# Patient Record
Sex: Female | Born: 1985 | Race: White | Hispanic: No | Marital: Single | Smoking: Former smoker
Health system: Southern US, Community
[De-identification: ages and names within clinical notes are randomized; demographics above are authoritative.]

## PROBLEM LIST (undated history)

## (undated) DIAGNOSIS — R011 Cardiac murmur, unspecified: Secondary | ICD-10-CM

## (undated) DIAGNOSIS — R569 Unspecified convulsions: Secondary | ICD-10-CM

## (undated) DIAGNOSIS — Z975 Presence of (intrauterine) contraceptive device: Secondary | ICD-10-CM

## (undated) DIAGNOSIS — I10 Essential (primary) hypertension: Secondary | ICD-10-CM

## (undated) HISTORY — DX: Presence of (intrauterine) contraceptive device: Z97.5

---
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
## 2006-10-12 ENCOUNTER — Other Ambulatory Visit: Admission: RE | Admit: 2006-10-12 | Discharge: 2006-10-12 | Payer: Self-pay | Admitting: Obstetrics and Gynecology

## 2007-10-22 ENCOUNTER — Other Ambulatory Visit: Admission: RE | Admit: 2007-10-22 | Discharge: 2007-10-22 | Payer: Self-pay | Admitting: Obstetrics and Gynecology

## 2008-10-02 ENCOUNTER — Emergency Department (HOSPITAL_COMMUNITY): Admission: EM | Admit: 2008-10-02 | Discharge: 2008-10-02 | Payer: Self-pay | Admitting: Emergency Medicine

## 2008-10-03 ENCOUNTER — Ambulatory Visit (HOSPITAL_COMMUNITY): Admission: RE | Admit: 2008-10-03 | Discharge: 2008-10-03 | Payer: Self-pay | Admitting: Obstetrics and Gynecology

## 2008-10-31 ENCOUNTER — Other Ambulatory Visit: Admission: RE | Admit: 2008-10-31 | Discharge: 2008-10-31 | Payer: Self-pay | Admitting: Obstetrics and Gynecology

## 2009-01-20 DIAGNOSIS — R569 Unspecified convulsions: Secondary | ICD-10-CM

## 2009-01-20 DIAGNOSIS — I10 Essential (primary) hypertension: Secondary | ICD-10-CM

## 2009-01-20 HISTORY — DX: Essential (primary) hypertension: I10

## 2009-01-20 HISTORY — DX: Unspecified convulsions: R56.9

## 2009-04-30 ENCOUNTER — Inpatient Hospital Stay (HOSPITAL_COMMUNITY): Admission: AD | Admit: 2009-04-30 | Discharge: 2009-05-07 | Payer: Self-pay | Admitting: Obstetrics and Gynecology

## 2009-04-30 ENCOUNTER — Ambulatory Visit: Payer: Self-pay | Admitting: Family Medicine

## 2009-05-03 ENCOUNTER — Encounter: Payer: Self-pay | Admitting: Obstetrics and Gynecology

## 2009-08-23 ENCOUNTER — Other Ambulatory Visit: Admission: RE | Admit: 2009-08-23 | Discharge: 2009-08-23 | Payer: Self-pay | Admitting: Obstetrics and Gynecology

## 2009-11-16 ENCOUNTER — Emergency Department (HOSPITAL_COMMUNITY): Admission: EM | Admit: 2009-11-16 | Discharge: 2009-11-16 | Payer: Self-pay | Admitting: Emergency Medicine

## 2010-04-09 LAB — CBC
MCHC: 34.6 g/dL (ref 30.0–36.0)
MCV: 96.2 fL (ref 78.0–100.0)
Platelets: 172 10*3/uL (ref 150–400)
RBC: 2.97 MIL/uL — ABNORMAL LOW (ref 3.87–5.11)
WBC: 15.3 10*3/uL — ABNORMAL HIGH (ref 4.0–10.5)
WBC: 17.9 10*3/uL — ABNORMAL HIGH (ref 4.0–10.5)

## 2010-04-09 LAB — COMPREHENSIVE METABOLIC PANEL
AST: 38 U/L — ABNORMAL HIGH (ref 0–37)
Albumin: 2.2 g/dL — ABNORMAL LOW (ref 3.5–5.2)
Calcium: 7.2 mg/dL — ABNORMAL LOW (ref 8.4–10.5)
Chloride: 102 mEq/L (ref 96–112)
Creatinine, Ser: 0.55 mg/dL (ref 0.4–1.2)
GFR calc Af Amer: >60 mL/min (ref >60)

## 2010-04-09 LAB — MAGNESIUM: Magnesium: 5.7 mg/dL — ABNORMAL HIGH (ref 1.5–2.5)

## 2010-04-09 LAB — RPR: RPR Ser Ql: NONREACTIVE

## 2010-04-09 LAB — URIC ACID: Uric Acid, Serum: 7.7 mg/dL — ABNORMAL HIGH (ref 2.4–7.0)

## 2010-04-10 LAB — COMPREHENSIVE METABOLIC PANEL
ALT: 16 U/L (ref 0–35)
ALT: 17 U/L (ref 0–35)
ALT: 17 U/L (ref 0–35)
AST: 28 U/L (ref 0–37)
AST: 29 U/L (ref 0–37)
AST: 32 U/L (ref 0–37)
Albumin: 2.2 g/dL — ABNORMAL LOW (ref 3.5–5.2)
Albumin: 2.4 g/dL — ABNORMAL LOW (ref 3.5–5.2)
Albumin: 2.6 g/dL — ABNORMAL LOW (ref 3.5–5.2)
Alkaline Phosphatase: 106 U/L (ref 39–117)
Alkaline Phosphatase: 119 U/L — ABNORMAL HIGH (ref 39–117)
Alkaline Phosphatase: 123 U/L — ABNORMAL HIGH (ref 39–117)
Alkaline Phosphatase: 123 U/L — ABNORMAL HIGH (ref 39–117)
Alkaline Phosphatase: 128 U/L — ABNORMAL HIGH (ref 39–117)
BUN: 1 mg/dL — ABNORMAL LOW (ref 6–23)
BUN: 2 mg/dL — ABNORMAL LOW (ref 6–23)
BUN: 3 mg/dL — ABNORMAL LOW (ref 6–23)
CO2: 25 mEq/L (ref 19–32)
CO2: 25 mEq/L (ref 19–32)
CO2: 26 mEq/L (ref 19–32)
Calcium: 8.2 mg/dL — ABNORMAL LOW (ref 8.4–10.5)
Chloride: 103 mEq/L (ref 96–112)
Chloride: 105 mEq/L (ref 96–112)
Chloride: 105 mEq/L (ref 96–112)
Creatinine, Ser: 0.52 mg/dL (ref 0.4–1.2)
GFR calc Af Amer: >60 mL/min (ref >60)
GFR calc Af Amer: >60 mL/min (ref >60)
GFR calc non Af Amer: >60 mL/min (ref >60)
GFR calc non Af Amer: >60 mL/min (ref >60)
GFR calc non Af Amer: >60 mL/min (ref >60)
Glucose, Bld: 126 mg/dL — ABNORMAL HIGH (ref 70–99)
Glucose, Bld: 90 mg/dL (ref 70–99)
Glucose, Bld: 90 mg/dL (ref 70–99)
Potassium: 2.6 mEq/L — CL (ref 3.5–5.1)
Potassium: 2.7 mEq/L — CL (ref 3.5–5.1)
Potassium: 2.7 mEq/L — CL (ref 3.5–5.1)
Potassium: 3 mEq/L — ABNORMAL LOW (ref 3.5–5.1)
Potassium: 3.3 mEq/L — ABNORMAL LOW (ref 3.5–5.1)
Sodium: 138 mEq/L (ref 135–145)
Sodium: 138 mEq/L (ref 135–145)
Sodium: 139 mEq/L (ref 135–145)
Sodium: 139 mEq/L (ref 135–145)
Total Bilirubin: 0.5 mg/dL (ref 0.3–1.2)
Total Bilirubin: 0.7 mg/dL (ref 0.3–1.2)
Total Bilirubin: 0.7 mg/dL (ref 0.3–1.2)
Total Protein: 5.7 g/dL — ABNORMAL LOW (ref 6.0–8.3)

## 2010-04-10 LAB — DIFFERENTIAL
Eosinophils Absolute: 0 10*3/uL (ref 0.0–0.7)
Eosinophils Relative: 0 % (ref 0–5)
Lymphocytes Relative: 6 % — ABNORMAL LOW (ref 12–46)
Lymphs Abs: 1.1 10*3/uL (ref 0.7–4.0)
Monocytes Absolute: 0.5 10*3/uL (ref 0.1–1.0)
Monocytes Relative: 3 % (ref 3–12)

## 2010-04-10 LAB — CBC
HCT: 31.3 % — ABNORMAL LOW (ref 36.0–46.0)
HCT: 31.6 % — ABNORMAL LOW (ref 36.0–46.0)
HCT: 33.8 % — ABNORMAL LOW (ref 36.0–46.0)
HCT: 35.7 % — ABNORMAL LOW (ref 36.0–46.0)
Hemoglobin: 10.6 g/dL — ABNORMAL LOW (ref 12.0–15.0)
Hemoglobin: 10.8 g/dL — ABNORMAL LOW (ref 12.0–15.0)
Hemoglobin: 11 g/dL — ABNORMAL LOW (ref 12.0–15.0)
Hemoglobin: 11.6 g/dL — ABNORMAL LOW (ref 12.0–15.0)
Hemoglobin: 12 g/dL (ref 12.0–15.0)
MCHC: 34.3 g/dL (ref 30.0–36.0)
MCHC: 34.3 g/dL (ref 30.0–36.0)
MCV: 94.8 fL (ref 78.0–100.0)
MCV: 96.2 fL (ref 78.0–100.0)
Platelets: 150 10*3/uL (ref 150–400)
Platelets: 157 10*3/uL (ref 150–400)
Platelets: 173 10*3/uL (ref 150–400)
Platelets: 176 10*3/uL (ref 150–400)
RBC: 3.22 MIL/uL — ABNORMAL LOW (ref 3.87–5.11)
RDW: 14.1 % (ref 11.5–15.5)
RDW: 14.4 % (ref 11.5–15.5)
WBC: 10.3 10*3/uL (ref 4.0–10.5)
WBC: 18.1 10*3/uL — ABNORMAL HIGH (ref 4.0–10.5)
WBC: 9.5 10*3/uL (ref 4.0–10.5)
WBC: 9.8 10*3/uL (ref 4.0–10.5)

## 2010-04-10 LAB — CREATININE CLEARANCE, URINE, 24 HOUR
Collection Interval-CRCL: 24 hours
Creatinine Clearance: 158 mL/min — ABNORMAL HIGH (ref 75–115)
Creatinine, 24H Ur: 1185 mg/d (ref 700–1800)
Urine Total Volume-CRCL: 3310 mL

## 2010-04-10 LAB — URIC ACID: Uric Acid, Serum: 6 mg/dL (ref 2.4–7.0)

## 2010-04-10 LAB — URINALYSIS, MICROSCOPIC ONLY
Bilirubin Urine: NEGATIVE
Hgb urine dipstick: NEGATIVE
Nitrite: NEGATIVE
Specific Gravity, Urine: 1.015 (ref 1.005–1.030)
pH: 7 (ref 5.0–8.0)

## 2010-04-10 LAB — MRSA PCR SCREENING: MRSA by PCR: NEGATIVE

## 2010-04-10 LAB — URINE CULTURE: Special Requests: NEGATIVE

## 2010-04-26 LAB — HCG, QUANTITATIVE, PREGNANCY: hCG, Beta Chain, Quant, S: 81792 m[IU]/mL — ABNORMAL HIGH (ref <5)

## 2010-06-07 NOTE — Discharge Summary (Signed)
NAME:  Frances Nguyen, Frances Nguyen                        ACCOUNT NO.:  [id number]   MEDICAL RECORD NO.:  [id number]                   PATIENT TYPE:  INP   LOCATION:  A330                                 FACILITY:  [facility]   PHYSICIAN:  Langley Gauss, M.D.                DATE OF BIRTH:  [birth year]   DATE OF ADMISSION:  07/16/2003  DATE OF DISCHARGE:  07/18/2003                                 DISCHARGE SUMMARY   DISCHARGE MEDICATIONS:  1. Zithromax 1 g p.o. x1.   The patient and her partner, this is upon request of the family.  They were  informed that both gonorrhea and chlamydia cultures obtained upon admission  in the emergency room by Dr. Rosalia Hammers were negative.  The patient does have a  history of prior STD one year previously which was treated.  They are very  apprehensive regarding this occurrence, and thus they request continued  treatment at this time.  Thus, at the time of discharge, 1 g p.o. x1 is  written both for the patient and for her partner.  The patient has received  three doses of Rocephin 1 g IV.  In addition, she did receive 1 g of  Zithromax already in the emergency room.   HOSPITAL COURSE:  The patient was seen by Dr. Rosalia Hammers July 16, 2003.  Admission  orders given.  Hospital care on July 17, 2003, per Dr. Lisette Grinder. T-max of 102  in the a.m.  Subsequently, the patient had marked decrease in pelvic pain.  She required no additional pain medication during the daytime.  She  tolerated regular general diet, was continued on Rocephin 1 g IV q.12h.  Should the patient continue to improve, she will be discharged to home in  the a.m. of July 18, 2003.  The patient did take her Ortho-Evra patch off  upon developing any nausea.  It has now been replaced.  The patient will  subsequently follow up with [facility] who has already been  contacting regarding placement of this patch.     ___________________________________________                                         Langley Gauss,  M.D.   DC/MEDQ  D:  07/17/2003  T:  07/18/2003  Job:  [id number]   cc:   [facility]

## 2010-06-07 NOTE — H&P (Signed)
NAME:  Frances Nguyen, Frances Nguyen                        ACCOUNT NO.:  [id number]   MEDICAL RECORD NO.:  [id number]                   PATIENT TYPE:  INP   LOCATION:  A330                                 FACILITY:  [facility]   PHYSICIAN:  Langley Gauss, M.D.                DATE OF BIRTH:  Dec 08, 1985   DATE OF ADMISSION:  07/16/2003  DATE OF DISCHARGE:                                HISTORY & PHYSICAL   IDENTIFYING INFORMATION:  The patient is evaluated on July 16, 2003 in the  emergency room by the emergency room physician, Dr. Margarita Grizzle.  I  admitted the patient to the hospital via verbal orders on July 16, 2003 with  the diagnosis of pelvic inflammatory disease.   SUBSEQUENT HOSPITAL CARE:  I first saw the patient the a.m. of July 17, 2003  on the third floor.  The patient is a 25 year old gravida 0, para 0 with her  last menstrual period July 06, 2003 who has just begun using Ortho-Evra  birth control patches.  She presents to the emergency room afternoon of July 16, 2003 with pain assessment of 10 out of 10.  Describes primarily  abdominal, gastrointestinal, and pelvic pain as well as some nausea.  She  initially felt the nausea was due to the birth control patch, thus she took  it off.  The pain is primarily suprapubic and bilateral in nature and was a  rather acute onset the a.m. of July 16, 2003.  Her past medical history is  otherwise negative.   SOCIAL HISTORY:  The patient is sexually active.  She is a nonsmoker, denies  any alcohol or drug use.   She has no known drug allergies.  Only medications is the Ortho-Evra patch.   PHYSICAL EXAMINATION:  VITAL SIGNS:  The patient presented to the emergency  room July 16, 2003 with a T-max of 101.9, blood pressure 106/60, heart rate  of 99, respiratory rate of 18.  HEENT:  Negative.  No adenopathy.  NECK:  Supple.  Thyroid is nonpalpable.  LUNGS:  Clear.  CARDIOVASCULAR:  Regular rate and rhythm.  PELVIC:  This was the initial evaluation  on July 16, 2003.  Dr. Rosalia Hammers  evaluated the patient and described cervical motion tenderness as well as  significant pelvic pain.   Urinalysis revealed a negative urine pregnancy test.  Hemoglobin 12.3,  hematocrit 34.7, white blood count of 12.9.  Liver function tests,  electrolytes within normal limits.  Wet prep is pertinent only for a few  white blood cells.  Group A strep screen was negative.  GC and Chlamydia  cultures performed by Dr. Rosalia Hammers in the emergency room.  A CT scan of the  pelvis was negative for appendicitis and revealed normal-appearing pelvic  structures.   I was thereafter contacted July 16, 2003 by Dr. Rosalia Hammers.  She told me following  her evaluation the presumptive diagnosis was that of pelvic  inflammatory  disease, thus the patient is admitted p.m. of July 16, 2003.  On July 17, 2003, the patient has had a T-max in the a.m. of 102.  She continues to have  some pelvic pain although it is markedly diminished.  She is tolerating a  regular general diet.  Abdomen is soft and flat.  No rebound, guarding, or  peritoneal signs are identified.   ASSESSMENT AND PLAN ON ADMISSION:  The patient is treated at this time with  1 g of Rocephin IV q.12 h.  She has received IV morphine in the emergency  room as well as 1 g of p.o. Zithromax.  The patient is to be admitted and  continued in hospital until 24 hours afebrile.  Of note, the patient is  registered in the emergency room as an unassigned patient, is listed as  Medicaid [employer].  After I rounded on the patient and admitted her  the a.m. of July 17, 2003, only after that point in time was I notified by  the family that the patient had been seen once at [facility].  Thus  with her management plan already in place and with good response, I  continued her hospital care.     ___________________________________________                                         Langley Gauss, M.D.   DC/MEDQ  D:  07/17/2003  T:   07/17/2003  Job:  [id number]   cc:   [facility]

## 2011-04-10 IMAGING — US US OB TRANSVAGINAL MODIFY
1 series · 14 of 28 positions shown · non-contrast
Comparison: None

CLINICAL DATA: Pregnant, bleeding

OBSTETRIC <14 WK US AND TRANSVAGINAL OB US
TECHNIQUE: Both transabdominal and transvaginal ultrasound
examinations were performed for complete evaluation of the
gestation as well as the maternal uterus, adnexal regions, and
pelvic cul-de-sac.

[Series 1: us ob transvaginal modify · 0.24mm/px · 46 acquisitions, 14 frames shown]
[im 2/46]
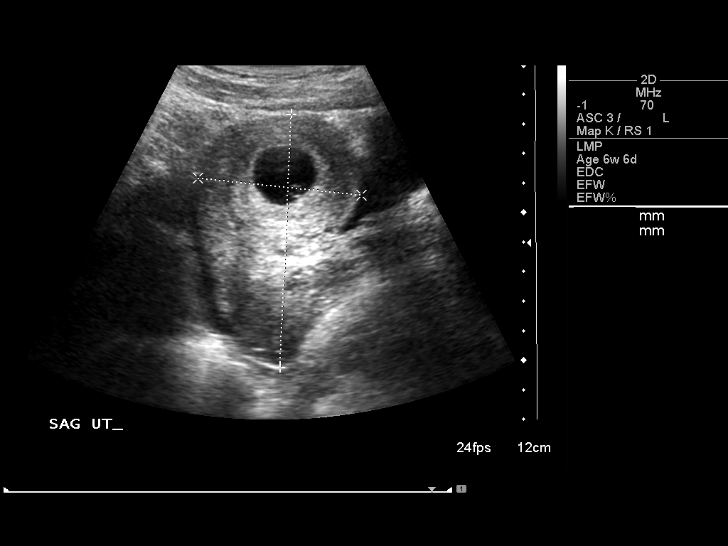
[im 6/46]
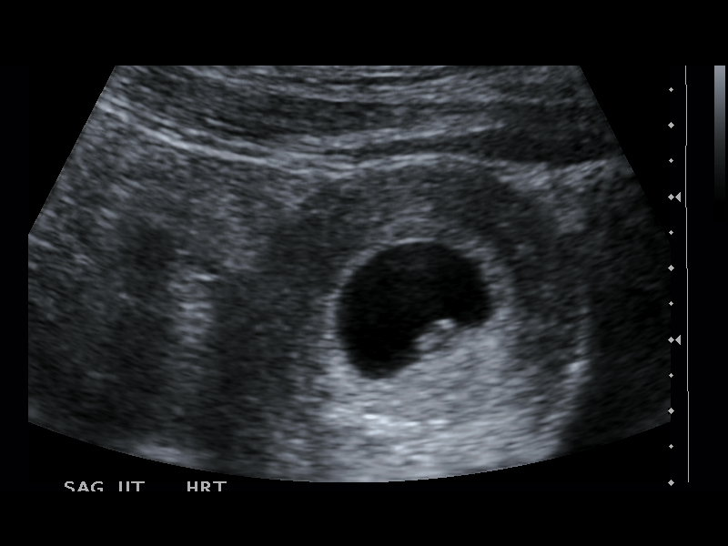
[im 9/46]
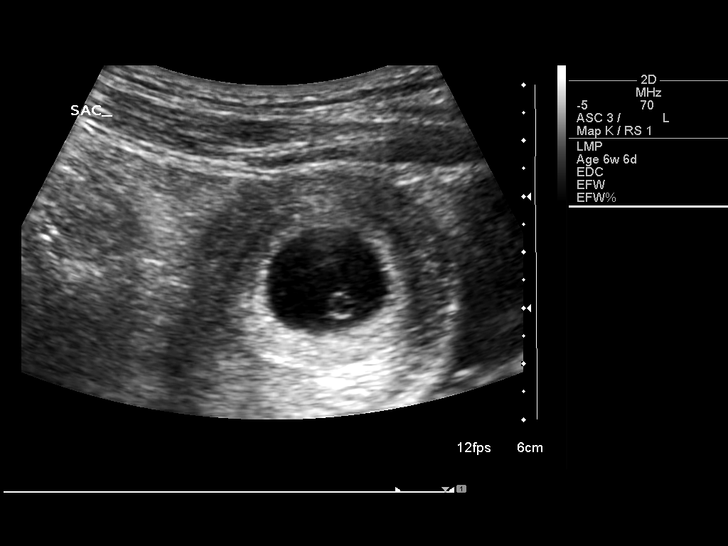
[im 12/46]
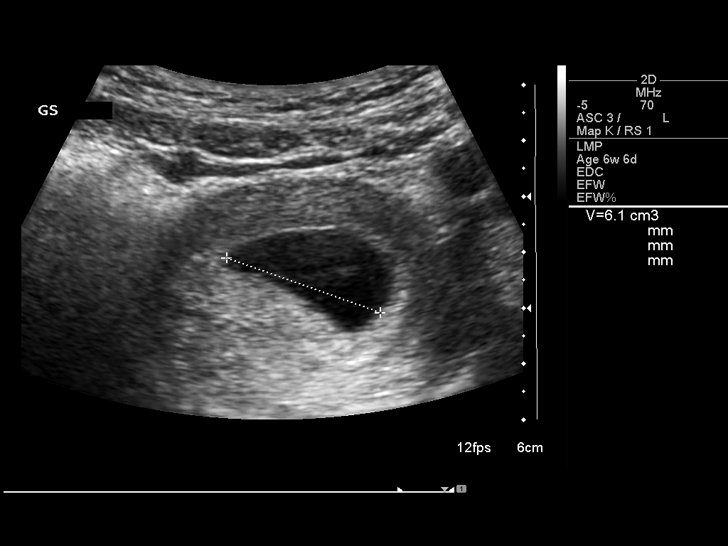
[im 16/46]
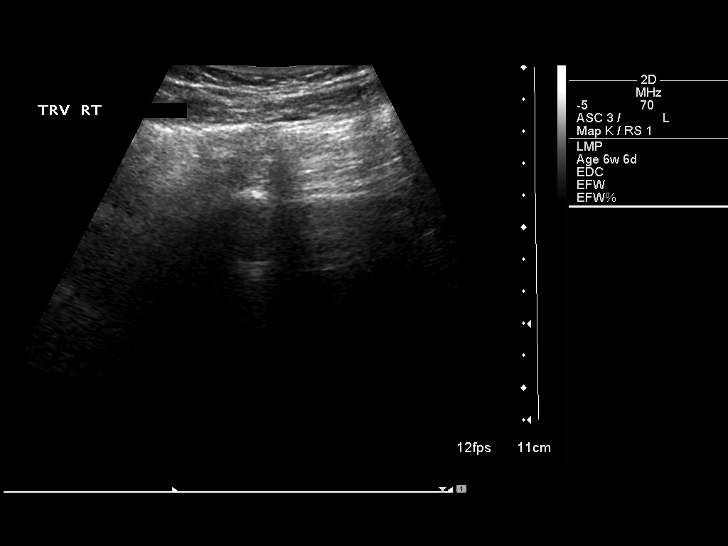
[im 19/46]
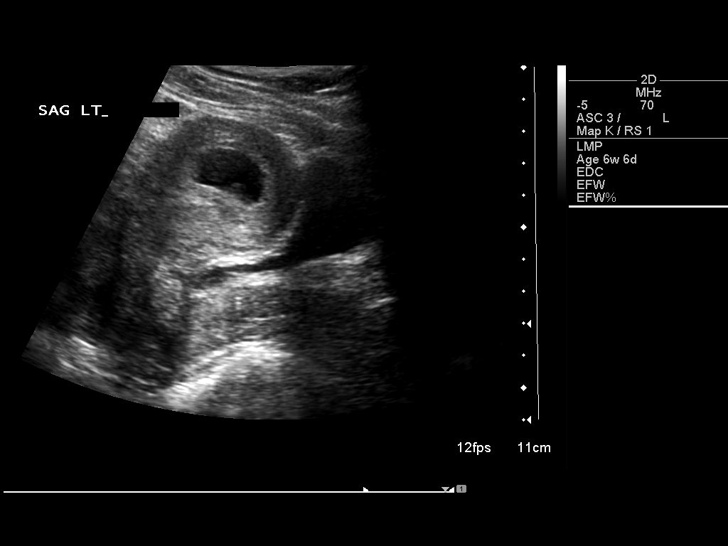
[im 22/46]
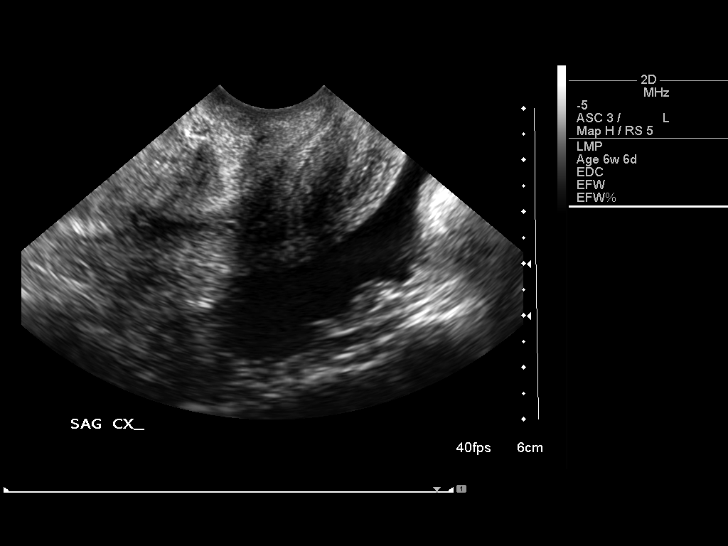
[im 26/46]
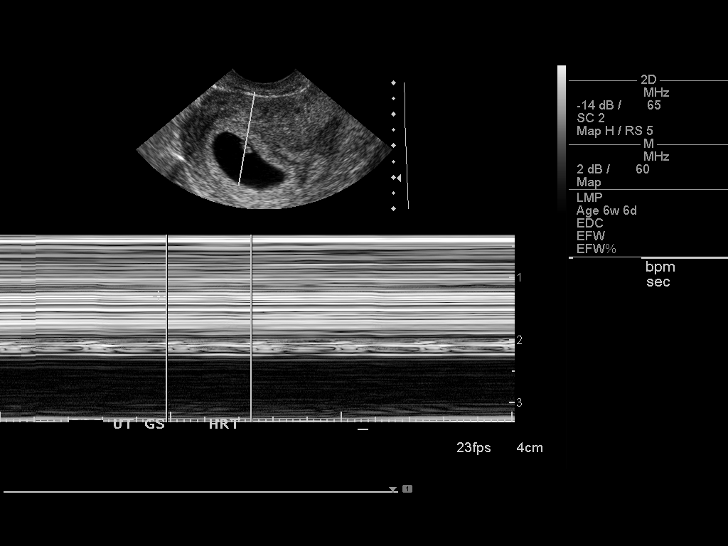
[im 29/46]
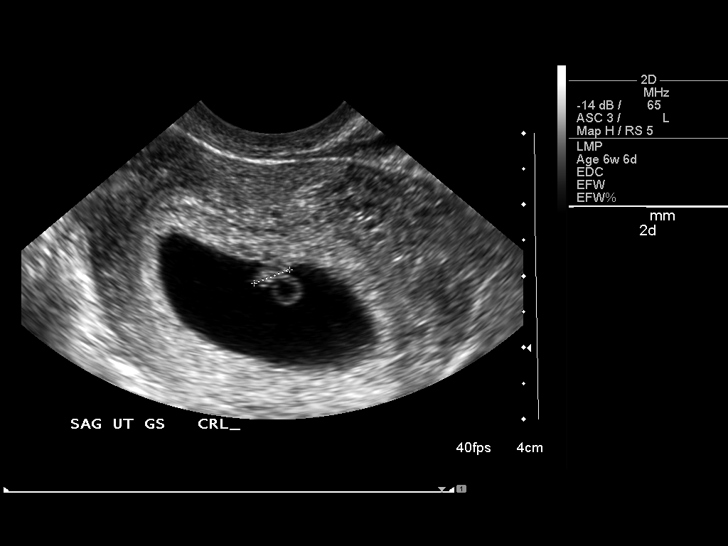
[im 32/46]
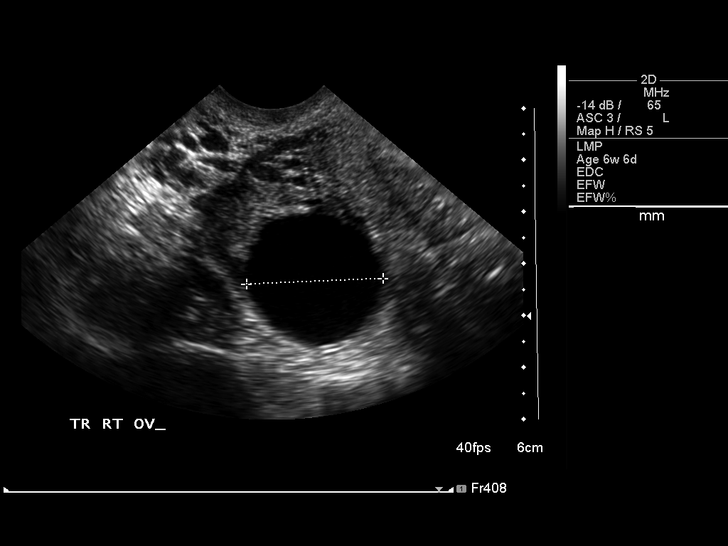
[im 36/46]
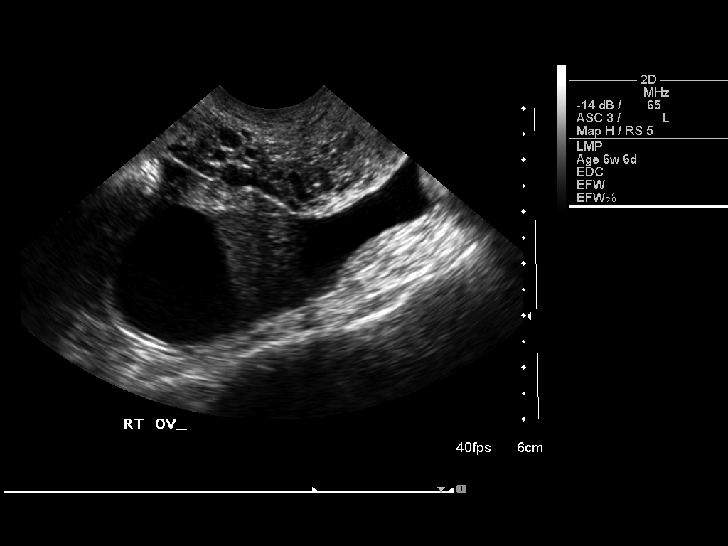
[im 39/46]
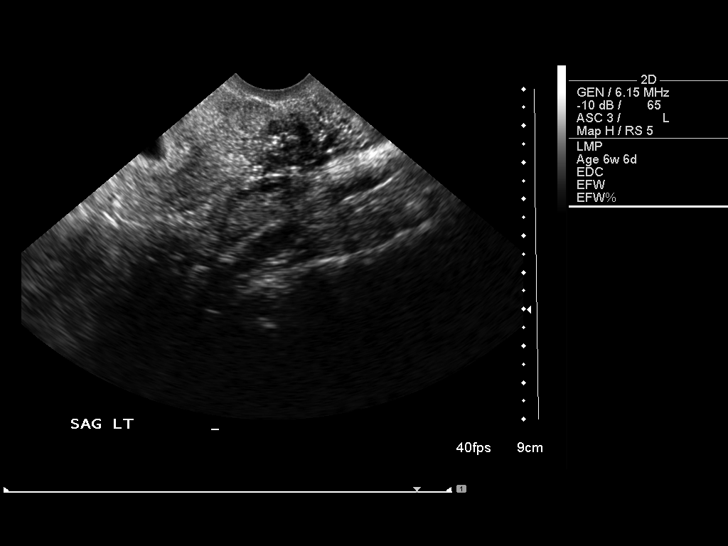
[im 42/46]
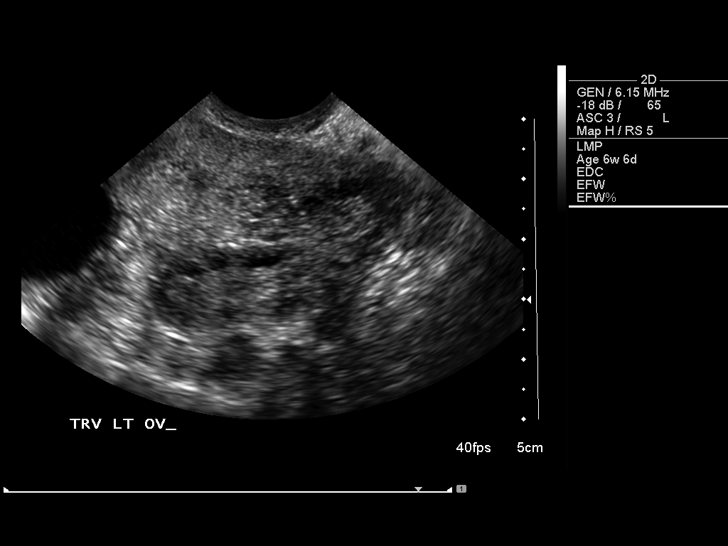
[im 46/46]
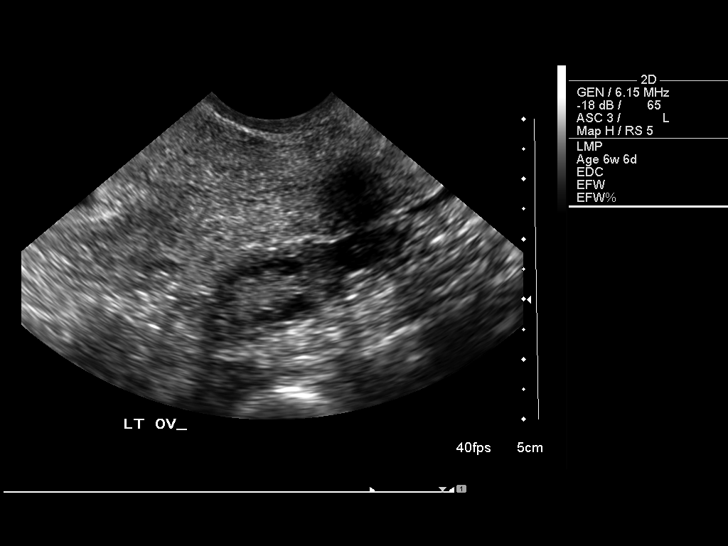

[14 of 28 positions shown; findings below may reference images not displayed]

Intrauterine gestational sac: Present
Yolk sac: Present
Embryo: Present
Cardiac Activity: Present
Heart Rate: 121 bpm

MSD:  25 mm       7 w   4 d
CRL: 6.2 mm           6  w   3  d         US EDC: 05/22/2009

Maternal uterus/adnexae:
No subchorionic hemorrhage.
Left ovary normal size and morphology, 3.0 x 1.3 x 3.2 cm.
Right ovary measures 4.4 x 2.8 x 3.4 cm contains a 2.4 x 2.5 x
cm corpus luteal cyst.
No adnexal masses.
Small amount of free fluid in pelvis.
IMPRESSION: Single live early intrauterine gestation measured at 7 weeks 0 days
average sonographic gestational age.
No acute abnormalities.

## 2012-08-12 ENCOUNTER — Emergency Department (HOSPITAL_COMMUNITY)
Admission: EM | Admit: 2012-08-12 | Discharge: 2012-08-13 | Disposition: A | Discharge status: Home / Self Care | Payer: Self-pay | Attending: Emergency Medicine | Admitting: Emergency Medicine

## 2012-08-12 ENCOUNTER — Encounter (HOSPITAL_COMMUNITY): Payer: Self-pay | Admitting: *Deleted

## 2012-08-12 DIAGNOSIS — R05 Cough: Secondary | ICD-10-CM | POA: Insufficient documentation

## 2012-08-12 DIAGNOSIS — R059 Cough, unspecified: Secondary | ICD-10-CM | POA: Insufficient documentation

## 2012-08-12 DIAGNOSIS — R51 Headache: Secondary | ICD-10-CM | POA: Insufficient documentation

## 2012-08-12 DIAGNOSIS — F17200 Nicotine dependence, unspecified, uncomplicated: Secondary | ICD-10-CM | POA: Insufficient documentation

## 2012-08-12 DIAGNOSIS — R071 Chest pain on breathing: Secondary | ICD-10-CM | POA: Insufficient documentation

## 2012-08-12 DIAGNOSIS — R0789 Other chest pain: Secondary | ICD-10-CM

## 2012-08-12 MED ORDER — DIPHENHYDRAMINE HCL 25 MG PO CAPS
25.0000 mg | ORAL_CAPSULE | Freq: Once | ORAL | Status: AC
  Administered 2012-08-12: 25 mg via ORAL
  Filled 2012-08-12: qty 1

## 2012-08-12 MED ORDER — KETOROLAC TROMETHAMINE 60 MG/2ML IM SOLN
60.0000 mg | Freq: Once | INTRAMUSCULAR | Status: AC
  Administered 2012-08-12: 60 mg via INTRAMUSCULAR
  Filled 2012-08-12: qty 2

## 2012-08-12 MED ORDER — ONDANSETRON 4 MG PO TBDP
4.0000 mg | ORAL_TABLET | Freq: Once | ORAL | Status: AC
  Administered 2012-08-12: 4 mg via ORAL
  Filled 2012-08-12: qty 1

## 2012-08-12 NOTE — ED Notes (Signed)
Pt reporting headache for 2 days.  Reporting severe cough.  States that she recently recovered from a URI.  Pt also reporting pain in right side of chest.  No vomiting, but reporting slight nausea.

## 2012-08-12 NOTE — ED Provider Notes (Signed)
CSN: [id number]     Arrival date & time 08/12/12  2130 History     First MD Initiated Contact with Patient 08/12/12 2301     Chief Complaint  Patient presents with  . Headache  . Cough  . Chest Pain   (Consider location/radiation/quality/duration/timing/severity/associated sxs/prior Treatment) HPI HPI Comments: Frances Nguyen is a 27 y.o. female who presents to the Emergency Department complaining of headache x 2 days that responds somewhat to tylenol. She also has right chest pain that is worse with coughing. Denies fever, chills, nausea, vomiting, shortness of breath.  History reviewed. No pertinent past medical history. History reviewed. No pertinent past surgical history. History reviewed. No pertinent family history. History  Substance Use Topics  . Smoking status: Current Every Day Smoker  . Smokeless tobacco: Not on file  . Alcohol Use: No   OB History   Grav Para Term Preterm Abortions TAB SAB Ect Mult Living                 Review of Systems  Constitutional: Negative for fever.       10 Systems reviewed and are negative for acute change except as noted in the HPI.  HENT: Negative for congestion.   Eyes: Negative for discharge and redness.  Respiratory: Negative for cough and shortness of breath.   Cardiovascular: Negative for chest pain.  Gastrointestinal: Negative for vomiting and abdominal pain.  Musculoskeletal: Negative for back pain.  Skin: Negative for rash.  Neurological: Positive for headaches. Negative for syncope and numbness.  Psychiatric/Behavioral:       No behavior change.    Allergies  Review of patient's allergies indicates no known allergies.  Home Medications   Current Outpatient Rx  Name  Route  Sig  Dispense  Refill  . acetaminophen (TYLENOL) 500 MG tablet   Oral   Take 1,000 mg by mouth every 6 (six) hours as needed for pain.          BP 127/88  Pulse 77  Temp(Src) 98.8 F (37.1 C) (Oral)  Resp 20  Ht 5\' 2"  (1.575 m)   Wt 113 lb (51.256 kg)  BMI 20.66 kg/m2  SpO2 100% Physical Exam  Nursing note and vitals reviewed. Constitutional:  Awake, alert, nontoxic appearance.  HENT:  Head: Normocephalic and atraumatic.  Eyes: EOM are normal. Pupils are equal, round, and reactive to light.  Neck: Neck supple.  Cardiovascular: Normal rate and intact distal pulses.   Pulmonary/Chest: Effort normal and breath sounds normal. She exhibits no tenderness.  Abdominal: Soft. Bowel sounds are normal. There is no tenderness. There is no rebound.  Musculoskeletal: She exhibits no tenderness.  Baseline ROM, no obvious new focal weakness.  Neurological:  Mental status and motor strength appears baseline for patient and situation.  Skin: No rash noted.  Psychiatric: She has a normal mood and affect.    ED Course   Procedures (including critical care time) Medications  ketorolac (TORADOL) injection 60 mg (60 mg Intramuscular Given 08/12/12 2326)  diphenhydrAMINE (BENADRYL) capsule 25 mg (25 mg Oral Given 08/12/12 2326)  ondansetron (ZOFRAN-ODT) disintegrating tablet 4 mg (4 mg Oral Given 08/12/12 2326)    MDM  Patient presents with headache x 2 days. Given toradol, benadryl and zofran with relief. Pt stable in ED with no significant deterioration in condition.The patient appears reasonably screened and/or stabilized for discharge and I doubt any other medical condition or other [facility] requiring further screening, evaluation, or treatment in the ED at this time  prior to discharge.  MDM Reviewed: nursing note and vitals     Nicoletta Dress. Colon Branch, MD 08/13/12 [phone]

## 2013-01-18 ENCOUNTER — Telehealth: Payer: Self-pay | Admitting: Obstetrics and Gynecology

## 2013-01-18 NOTE — Telephone Encounter (Signed)
Spoke with pt she was seen in ER at [facility]. I advised the pt to call the hospital and see if there was anyway she could get the results from them.

## 2013-08-02 ENCOUNTER — Other Ambulatory Visit (HOSPITAL_COMMUNITY)
Admission: RE | Admit: 2013-08-02 | Discharge: 2013-08-02 | Disposition: A | Discharge status: Home / Self Care | Payer: Medicaid Other | Source: Ambulatory Visit | Attending: Adult Health | Admitting: Adult Health

## 2013-08-02 ENCOUNTER — Encounter: Payer: Self-pay | Admitting: Adult Health

## 2013-08-02 ENCOUNTER — Encounter: Payer: Medicaid Other | Admitting: Adult Health

## 2013-08-02 ENCOUNTER — Ambulatory Visit (INDEPENDENT_AMBULATORY_CARE_PROVIDER_SITE_OTHER): Payer: Medicaid Other | Admitting: Adult Health

## 2013-08-02 VITALS — BP 100/62 | HR 74 | Ht 62.0 in | Wt 121.0 lb

## 2013-08-02 DIAGNOSIS — Z113 Encounter for screening for infections with a predominantly sexual mode of transmission: Secondary | ICD-10-CM | POA: Insufficient documentation

## 2013-08-02 DIAGNOSIS — Z975 Presence of (intrauterine) contraceptive device: Secondary | ICD-10-CM

## 2013-08-02 DIAGNOSIS — Z01419 Encounter for gynecological examination (general) (routine) without abnormal findings: Secondary | ICD-10-CM | POA: Insufficient documentation

## 2013-08-02 DIAGNOSIS — Z3049 Encounter for surveillance of other contraceptives: Secondary | ICD-10-CM

## 2013-08-02 HISTORY — DX: Presence of (intrauterine) contraceptive device: Z97.5

## 2013-08-02 NOTE — Progress Notes (Signed)
Patient ID: Frances Nguyen, female   DOB: 1985-05-01, 28 y.o.   MRN: [id number] History of Present Illness: Frances Nguyen is a 28 year old white female, in for a family planning pap and physical.She complains of some occasional pain with sex and pain right side.Has IUD for about 4 years now and wants it removed, not sure if wants the pill or will proceed with trying to get pregnant.   Current Medications, Allergies, Past Medical History, Past Surgical History, Family History and Social History were reviewed in [facility] electronic medical record.     Review of Systems: Patient denies any headaches, blurred vision, shortness of breath, chest pain, abdominal pain, problems with bowel movements, or urination. No joint pain or mood swings, see HPI for positives.    Physical Exam:BP 100/62  Pulse 74  Ht 5\' 2"  (1.575 m)  Wt 121 lb (54.885 kg)  BMI 22.13 kg/m2 General:  Well developed, well nourished, no acute distress Skin:  Warm and dry, has tattoos Neck:  Midline trachea, normal thyroid Lungs; Clear to auscultation bilaterally Breast:  No dominant palpable mass, retraction, or nipple discharge Cardiovascular: Regular rate and rhythm Abdomen:  Soft, non tender, no hepatosplenomegaly Pelvic:  External genitalia is normal in appearance.  The vagina is normal in appearance. The cervix is bulbous.+IUD strings at os, pap with GC/CHL performed.  Uterus is felt to be normal size, shape, and contour.  No  adnexal masses, mild  tenderness noted RLQ. Extremities:  No swelling or varicosities noted Psych:  No mood changes, alert and cooperative,seems happy   Impression: Family planning gyn exam IUD in place    Plan: Check HIV,RPR,HSV 2 Physical in 1 year Return in am for IUD removal Review handout on contraception and talk with partner tonight to see what they want to do

## 2013-08-02 NOTE — Patient Instructions (Signed)
Contraception Choices Contraception (birth control) is the use of any methods or devices to prevent pregnancy. Below are some methods to help avoid pregnancy. HORMONAL METHODS   Contraceptive implant. This is a thin, plastic tube containing progesterone hormone. It does not contain estrogen hormone. Your health care provider inserts the tube in the inner part of the upper arm. The tube can remain in place for up to 3 years. After 3 years, the implant must be removed. The implant prevents the ovaries from releasing an egg (ovulation), thickens the cervical mucus to prevent sperm from entering the uterus, and thins the lining of the inside of the uterus.  Progesterone-only injections. These injections are given every 3 months by your health care provider to prevent pregnancy. This synthetic progesterone hormone stops the ovaries from releasing eggs. It also thickens cervical mucus and changes the uterine lining. This makes it harder for sperm to survive in the uterus.  Birth control pills. These pills contain estrogen and progesterone hormone. They work by preventing the ovaries from releasing eggs (ovulation). They also cause the cervical mucus to thicken, preventing the sperm from entering the uterus. Birth control pills are prescribed by a health care provider.Birth control pills can also be used to treat heavy periods.  Minipill. This type of birth control pill contains only the progesterone hormone. They are taken every day of each month and must be prescribed by your health care provider.  Birth control patch. The patch contains hormones similar to those in birth control pills. It must be changed once a week and is prescribed by a health care provider.  Vaginal ring. The ring contains hormones similar to those in birth control pills. It is left in the vagina for 3 weeks, removed for 1 week, and then a new one is put back in place. The patient must be comfortable inserting and removing the ring  from the vagina.A health care provider's prescription is necessary.  Emergency contraception. Emergency contraceptives prevent pregnancy after unprotected sexual intercourse. This pill can be taken right after sex or up to 5 days after unprotected sex. It is most effective the sooner you take the pills after having sexual intercourse. Most emergency contraceptive pills are available without a prescription. Check with your pharmacist. Do not use emergency contraception as your only form of birth control. BARRIER METHODS   Female condom. This is a thin sheath (latex or rubber) that is worn over the penis during sexual intercourse. It can be used with spermicide to increase effectiveness.  Female condom. This is a soft, loose-fitting sheath that is put into the vagina before sexual intercourse.  Diaphragm. This is a soft, latex, dome-shaped barrier that must be fitted by a health care provider. It is inserted into the vagina, along with a spermicidal jelly. It is inserted before intercourse. The diaphragm should be left in the vagina for 6 to 8 hours after intercourse.  Cervical cap. This is a round, soft, latex or plastic cup that fits over the cervix and must be fitted by a health care provider. The cap can be left in place for up to 48 hours after intercourse.  Sponge. This is a soft, circular piece of polyurethane foam. The sponge has spermicide in it. It is inserted into the vagina after wetting it and before sexual intercourse.  Spermicides. These are chemicals that kill or block sperm from entering the cervix and uterus. They come in the form of creams, jellies, suppositories, foam, or tablets. They do not require a   prescription. They are inserted into the vagina with an applicator before having sexual intercourse. The process must be repeated every time you have sexual intercourse. INTRAUTERINE CONTRACEPTION  Intrauterine device (IUD). This is a T-shaped device that is put in a woman's uterus  during a menstrual period to prevent pregnancy. There are 2 types:  Copper IUD. This type of IUD is wrapped in copper wire and is placed inside the uterus. Copper makes the uterus and fallopian tubes produce a fluid that kills sperm. It can stay in place for 10 years.  Hormone IUD. This type of IUD contains the hormone progestin (synthetic progesterone). The hormone thickens the cervical mucus and prevents sperm from entering the uterus, and it also thins the uterine lining to prevent implantation of a fertilized egg. The hormone can weaken or kill the sperm that get into the uterus. It can stay in place for 3-5 years, depending on which type of IUD is used. PERMANENT METHODS OF CONTRACEPTION  Female tubal ligation. This is when the woman's fallopian tubes are surgically sealed, tied, or blocked to prevent the egg from traveling to the uterus.  Hysteroscopic sterilization. This involves placing a small coil or insert into each fallopian tube. Your doctor uses a technique called hysteroscopy to do the procedure. The device causes scar tissue to form. This results in permanent blockage of the fallopian tubes, so the sperm cannot fertilize the egg. It takes about 3 months after the procedure for the tubes to become blocked. You must use another form of birth control for these 3 months.  Female sterilization. This is when the female has the tubes that carry sperm tied off (vasectomy).This blocks sperm from entering the vagina during sexual intercourse. After the procedure, the man can still ejaculate fluid (semen). NATURAL PLANNING METHODS  Natural family planning. This is not having sexual intercourse or using a barrier method (condom, diaphragm, cervical cap) on days the woman could become pregnant.  Calendar method. This is keeping track of the length of each menstrual cycle and identifying when you are fertile.  Ovulation method. This is avoiding sexual intercourse during ovulation.  Symptothermal  method. This is avoiding sexual intercourse during ovulation, using a thermometer and ovulation symptoms.  Post-ovulation method. This is timing sexual intercourse after you have ovulated. Regardless of which type or method of contraception you choose, it is important that you use condoms to protect against the transmission of sexually transmitted infections (STIs). Talk with your health care provider about which form of contraception is most appropriate for you. Document Released: 01/06/2005 Document Revised: 01/11/2013 Document Reviewed: 07/01/2012 [facility] Patient Information 2015 [employer] [place]. This information is not intended to replace advice given to you by your health care provider. Make sure you discuss any questions you have with your health care provider. return in 1 day for IUD removal Physical  In  1year

## 2013-08-03 LAB — CYTOLOGY - PAP

## 2013-08-03 LAB — HSV 2 ANTIBODY, IGG: HSV 2 Glycoprotein G Ab, IgG: <0.1 IV

## 2013-08-03 LAB — RPR

## 2013-08-03 LAB — HIV ANTIBODY (ROUTINE TESTING W REFLEX): HIV 1&2 Ab, 4th Generation: NONREACTIVE

## 2013-08-03 NOTE — Progress Notes (Signed)
This encounter was created in error - please disregard.

## 2013-08-04 ENCOUNTER — Encounter: Payer: Self-pay | Admitting: Adult Health

## 2013-08-04 ENCOUNTER — Ambulatory Visit (INDEPENDENT_AMBULATORY_CARE_PROVIDER_SITE_OTHER): Payer: Medicaid Other | Admitting: Adult Health

## 2013-08-04 VITALS — BP 100/64 | Ht 62.0 in | Wt 118.0 lb

## 2013-08-04 DIAGNOSIS — Z30432 Encounter for removal of intrauterine contraceptive device: Secondary | ICD-10-CM

## 2013-08-04 NOTE — Progress Notes (Signed)
Subjective:     Patient ID: Frances Nguyen, female   DOB: 1985/11/20, 28 y.o.   MRN: [id number]  HPI [name] is a 28 year old white female in for IUD removal, no complaints, wants to be pregnant.  Review of Systems See HPI Reviewed past medical,surgical, social and family history. Reviewed medications and allergies.     Objective:   Physical Exam BP 100/64  Ht 5\' 2"  (1.575 m)  Wt 118 lb (53.524 kg)  BMI 21.58 kg/m2   Skin warm and dry.Pelvic: external genitalia is normal in appearance, vagina is pink with good mositure, cervix:smooth and bulbous, +IUD strings seen and IUD easily removed with forceps, uterus: normal size, shape and contour, non tender, no masses felt, adnexa: no masses or tenderness noted.  Assessment:    IUD removal    Plan:     Take OTC prenatal or flintstones Follow up prn

## 2013-08-04 NOTE — Patient Instructions (Signed)
Take flintstones Follow up prn

## 2013-11-21 ENCOUNTER — Encounter: Payer: Self-pay | Admitting: Adult Health

## 2013-11-29 ENCOUNTER — Ambulatory Visit (INDEPENDENT_AMBULATORY_CARE_PROVIDER_SITE_OTHER): Payer: Medicaid Other

## 2013-11-29 ENCOUNTER — Other Ambulatory Visit: Payer: Self-pay | Admitting: Obstetrics and Gynecology

## 2013-11-29 DIAGNOSIS — O3680X0 Pregnancy with inconclusive fetal viability, not applicable or unspecified: Secondary | ICD-10-CM

## 2013-11-29 NOTE — Progress Notes (Signed)
U/S-single IUP with +FCA noted, CRL c/w 6+2wks EDD 07/23/2014, cx appears closed, bilateral adnexa appears WNL, FHR-119 bpm

## 2013-12-12 ENCOUNTER — Ambulatory Visit (INDEPENDENT_AMBULATORY_CARE_PROVIDER_SITE_OTHER): Payer: Medicaid Other | Admitting: Women's Health

## 2013-12-12 ENCOUNTER — Encounter: Payer: Self-pay | Admitting: Women's Health

## 2013-12-12 VITALS — BP 98/60 | Wt 113.0 lb

## 2013-12-12 DIAGNOSIS — Z118 Encounter for screening for other infectious and parasitic diseases: Secondary | ICD-10-CM

## 2013-12-12 DIAGNOSIS — Z113 Encounter for screening for infections with a predominantly sexual mode of transmission: Secondary | ICD-10-CM

## 2013-12-12 DIAGNOSIS — Z3490 Encounter for supervision of normal pregnancy, unspecified, unspecified trimester: Secondary | ICD-10-CM | POA: Insufficient documentation

## 2013-12-12 DIAGNOSIS — Z3682 Encounter for antenatal screening for nuchal translucency: Secondary | ICD-10-CM

## 2013-12-12 DIAGNOSIS — Z3401 Encounter for supervision of normal first pregnancy, first trimester: Secondary | ICD-10-CM

## 2013-12-12 DIAGNOSIS — Z1159 Encounter for screening for other viral diseases: Secondary | ICD-10-CM

## 2013-12-12 DIAGNOSIS — Z1371 Encounter for nonprocreative screening for genetic disease carrier status: Secondary | ICD-10-CM

## 2013-12-12 DIAGNOSIS — O34219 Maternal care for unspecified type scar from previous cesarean delivery: Secondary | ICD-10-CM

## 2013-12-12 DIAGNOSIS — Z1389 Encounter for screening for other disorder: Secondary | ICD-10-CM

## 2013-12-12 DIAGNOSIS — Z331 Pregnant state, incidental: Secondary | ICD-10-CM

## 2013-12-12 DIAGNOSIS — Z114 Encounter for screening for human immunodeficiency virus [HIV]: Secondary | ICD-10-CM

## 2013-12-12 DIAGNOSIS — O3421 Maternal care for scar from previous cesarean delivery: Secondary | ICD-10-CM

## 2013-12-12 DIAGNOSIS — Z8759 Personal history of other complications of pregnancy, childbirth and the puerperium: Secondary | ICD-10-CM | POA: Insufficient documentation

## 2013-12-12 DIAGNOSIS — Z0283 Encounter for blood-alcohol and blood-drug test: Secondary | ICD-10-CM

## 2013-12-12 DIAGNOSIS — Z3491 Encounter for supervision of normal pregnancy, unspecified, first trimester: Secondary | ICD-10-CM

## 2013-12-12 DIAGNOSIS — Z0184 Encounter for antibody response examination: Secondary | ICD-10-CM

## 2013-12-12 LAB — POCT URINALYSIS DIPSTICK
Blood, UA: NEGATIVE
GLUCOSE UA: NEGATIVE
KETONES UA: NEGATIVE
LEUKOCYTES UA: NEGATIVE
Nitrite, UA: NEGATIVE
Protein, UA: NEGATIVE

## 2013-12-12 NOTE — Progress Notes (Signed)
  Subjective:  Frances Nguyen is a 28 y.o. 442P1001 Caucasian female at 765w1d by 6wk u/s, being seen today for her first obstetrical visit.  Her obstetrical history is significant for smoker: quit after she found out she was pregnant, h/o IOL for GHTN developed eclamptic seizure and had stat c/s for fetal bradycardia last pregnancy.  Pregnancy history fully reviewed.  Patient reports n/v-requests meds. Denies vb, cramping, uti s/s, abnormal/malodorous vag d/c, or vulvovaginal itching/irritation.  BP 98/60 mmHg  Wt 113 lb (51.256 kg)  LMP 10/08/2013 (Approximate)  HISTORY: OB History  Gravida Para Term Preterm AB SAB TAB Ectopic Multiple Living  2 1 1       1     # Outcome Date GA Lbr Len/2nd Weight Sex Delivery Anes PTL Lv  2 Current           1 Term 05/03/09 6251w0d  6 lb 10 oz (3.005 kg) M CS-LTranv EPI N Y     Past Medical History  Diagnosis Date  . IUD (intrauterine device) in place 08/02/2013   Past Surgical History  Procedure Laterality Date  . Cesarean section     Family History  Problem Relation Age of Onset  . COPD Maternal Grandmother   . COPD Mother     Exam   System:     General: Well developed & nourished, no acute distress   Skin: Warm & dry, normal coloration and turgor, no rashes   Neurologic: Alert & oriented, normal mood   Cardiovascular: Regular rate & rhythm   Respiratory: Effort & rate normal, LCTAB, acyanotic   Abdomen: Soft, non tender   Extremities: normal strength, tone   Pelvic Exam:    Perineum: Normal perineum   Vulva: Normal, no lesions   Vagina:  Normal mucosa, normal discharge   Cervix: Normal, bulbous, appears closed   Uterus: Normal size/shape/contour for GA   Thin prep pap smear neg July 2015 [facility]: will see tasha before she leaves   Assessment:   Pregnancy: G2P1001 Patient Active Problem List   Diagnosis Date Noted  . Supervision of normal pregnancy 12/12/2013    Priority: High  . History of cesarean delivery, currently  pregnant 12/12/2013    Priority: High  . History of eclampsia 12/12/2013    Priority: High    3665w1d G2P1001 New OB visit Prior smoker- now quit H/O GHTN w/ eclamptic seizure H/O stat C/S d/t fetal bradycardia during eclamptic seizure N/V of pregnancy  Plan:  Initial labs drawn Continue prenatal vitamins Problem list reviewed and updated Reviewed n/v relief measures and warning s/s to report 2 samples diclegis given, pt will call us when she receives her [facility] card so we can rx Reviewed recommended weight gain based on pre-gravid BMI Encouraged well-balanced diet Genetic Screening discussed Integrated Screen: requested Cystic fibrosis screening discussed requested Ultrasound discussed; fetal survey: requested Follow up in 4 weeks for 1st IT/NT and visit [facility] completed Begin daily baby asa @ 12wks d/t h/o ghtn/eclampsia Discussed TOLAC, consent given to take home and review  Marge DuncansBooker, Talon Witting Randall CNM, [facility]NP-BC 12/12/2013 2:46 PM

## 2013-12-12 NOTE — Patient Instructions (Addendum)
Begin taking a 81mg baby aspirin daily at 12 weeks of pregnancy to decrease risk of preeclampsia during pregnancy   Nausea & Vomiting  Have saltine crackers or pretzels by your bed and eat a few bites before you raise your head out of bed in the morning  Eat small frequent meals throughout the day instead of large meals  Drink plenty of fluids throughout the day to stay hydrated, just don't drink a lot of fluids with your meals.  This can make your stomach fill up faster making you feel sick  Do not brush your teeth right after you eat  Products with real ginger are good for nausea, like ginger ale and ginger hard candy Make sure it says made with real ginger!  Sucking on sour candy like lemon heads is also good for nausea  If your prenatal vitamins make you nauseated, take them at night so you will sleep through the nausea  Sea Bands  If you feel like you need medicine for the nausea & vomiting please let us know  If you are unable to keep any fluids or food down please let us know   First Trimester of Pregnancy The first trimester of pregnancy is from week 1 until the end of week 12 (months 1 through 3). A week after a sperm fertilizes an egg, the egg will implant on the wall of the uterus. This embryo will begin to develop into a baby. Genes from you and your partner are forming the baby. The female genes determine whether the baby is a boy or a girl. At 6-8 weeks, the eyes and face are formed, and the heartbeat can be seen on ultrasound. At the end of 12 weeks, all the baby's organs are formed.  Now that you are pregnant, you will want to do everything you can to have a healthy baby. Two of the most important things are to get good prenatal care and to follow your health care provider's instructions. Prenatal care is all the medical care you receive before the baby's birth. This care will help prevent, find, and treat any problems during the pregnancy and childbirth. BODY CHANGES Your  body goes through many changes during pregnancy. The changes vary from woman to woman.   You may gain or lose a couple of pounds at first.  You may feel sick to your stomach (nauseous) and throw up (vomit). If the vomiting is uncontrollable, call your health care provider.  You may tire easily.  You may develop headaches that can be relieved by medicines approved by your health care provider.  You may urinate more often. Painful urination may mean you have a bladder infection.  You may develop heartburn as a result of your pregnancy.  You may develop constipation because certain hormones are causing the muscles that push waste through your intestines to slow down.  You may develop hemorrhoids or swollen, bulging veins (varicose veins).  Your breasts may begin to grow larger and become tender. Your nipples may stick out more, and the tissue that surrounds them (areola) may become darker.  Your gums may bleed and may be sensitive to brushing and flossing.  Dark spots or blotches (chloasma, mask of pregnancy) may develop on your face. This will likely fade after the baby is born.  Your menstrual periods will stop.  You may have a loss of appetite.  You may develop cravings for certain kinds of food.  You may have changes in your emotions from day to   day, such as being excited to be pregnant or being concerned that something may go wrong with the pregnancy and baby.  You may have more vivid and strange dreams.  You may have changes in your hair. These can include thickening of your hair, rapid growth, and changes in texture. Some women also have hair loss during or after pregnancy, or hair that feels dry or thin. Your hair will most likely return to normal after your baby is born. WHAT TO EXPECT AT YOUR PRENATAL VISITS During a routine prenatal visit:  You will be weighed to make sure you and the baby are growing normally.  Your blood pressure will be taken.  Your abdomen will  be measured to track your baby's growth.  The fetal heartbeat will be listened to starting around week 10 or 12 of your pregnancy.  Test results from any previous visits will be discussed. Your health care provider may ask you:  How you are feeling.  If you are feeling the baby move.  If you have had any abnormal symptoms, such as leaking fluid, bleeding, severe headaches, or abdominal cramping.  If you have any questions. Other tests that may be performed during your first trimester include:  Blood tests to find your blood type and to check for the presence of any previous infections. They will also be used to check for low iron levels (anemia) and Rh antibodies. Later in the pregnancy, blood tests for diabetes will be done along with other tests if problems develop.  Urine tests to check for infections, diabetes, or protein in the urine.  An ultrasound to confirm the proper growth and development of the baby.  An amniocentesis to check for possible genetic problems.  Fetal screens for spina bifida and Down syndrome.  You may need other tests to make sure you and the baby are doing well. HOME CARE INSTRUCTIONS  Medicines  Follow your health care provider's instructions regarding medicine use. Specific medicines may be either safe or unsafe to take during pregnancy.  Take your prenatal vitamins as directed.  If you develop constipation, try taking a stool softener if your health care provider approves. Diet  Eat regular, well-balanced meals. Choose a variety of foods, such as meat or vegetable-based protein, fish, milk and low-fat dairy products, vegetables, fruits, and whole grain breads and cereals. Your health care provider will help you determine the amount of weight gain that is right for you.  Avoid raw meat and uncooked cheese. These carry germs that can cause birth defects in the baby.  Eating four or five small meals rather than three large meals a day may help  relieve nausea and vomiting. If you start to feel nauseous, eating a few soda crackers can be helpful. Drinking liquids between meals instead of during meals also seems to help nausea and vomiting.  If you develop constipation, eat more high-fiber foods, such as fresh vegetables or fruit and whole grains. Drink enough fluids to keep your urine clear or pale yellow. Activity and Exercise  Exercise only as directed by your health care provider. Exercising will help you:  Control your weight.  Stay in shape.  Be prepared for labor and delivery.  Experiencing pain or cramping in the lower abdomen or low back is a good sign that you should stop exercising. Check with your health care provider before continuing normal exercises.  Try to avoid standing for long periods of time. Move your legs often if you must stand in one place   for a long time.  Avoid heavy lifting.  Wear low-heeled shoes, and practice good posture.  You may continue to have sex unless your health care provider directs you otherwise. Relief of Pain or Discomfort  Wear a good support bra for breast tenderness.   Take warm sitz baths to soothe any pain or discomfort caused by hemorrhoids. Use hemorrhoid cream if your health care provider approves.   Rest with your legs elevated if you have leg cramps or low back pain.  If you develop varicose veins in your legs, wear support hose. Elevate your feet for 15 minutes, 3-4 times a day. Limit salt in your diet. Prenatal Care  Schedule your prenatal visits by the twelfth week of pregnancy. They are usually scheduled monthly at first, then more often in the last 2 months before delivery.  Write down your questions. Take them to your prenatal visits.  Keep all your prenatal visits as directed by your health care provider. Safety  Wear your seat belt at all times when driving.  Make a list of emergency phone numbers, including numbers for family, friends, the hospital, and  police and fire departments. General Tips  Ask your health care provider for a referral to a local prenatal education class. Begin classes no later than at the beginning of month 6 of your pregnancy.  Ask for help if you have counseling or nutritional needs during pregnancy. Your health care provider can offer advice or refer you to specialists for help with various needs.  Do not use hot tubs, steam rooms, or saunas.  Do not douche or use tampons or scented sanitary pads.  Do not cross your legs for long periods of time.  Avoid cat litter boxes and soil used by cats. These carry germs that can cause birth defects in the baby and possibly loss of the fetus by miscarriage or stillbirth.  Avoid all smoking, herbs, alcohol, and medicines not prescribed by your health care provider. Chemicals in these affect the formation and growth of the baby.  Schedule a dentist appointment. At home, brush your teeth with a soft toothbrush and be gentle when you floss. SEEK MEDICAL CARE IF:   You have dizziness.  You have mild pelvic cramps, pelvic pressure, or nagging pain in the abdominal area.  You have persistent nausea, vomiting, or diarrhea.  You have a bad smelling vaginal discharge.  You have pain with urination.  You notice increased swelling in your face, hands, legs, or ankles. SEEK IMMEDIATE MEDICAL CARE IF:   You have a fever.  You are leaking fluid from your vagina.  You have spotting or bleeding from your vagina.  You have severe abdominal cramping or pain.  You have rapid weight gain or loss.  You vomit blood or material that looks like coffee grounds.  You are exposed to German measles and have never had them.  You are exposed to fifth disease or chickenpox.  You develop a severe headache.  You have shortness of breath.  You have any kind of trauma, such as from a fall or a car accident. Document Released: 12/31/2000 Document Revised: 05/23/2013 Document Reviewed:  11/16/2012 Exit[employer] Patient Information 2015 [employer]. This information is not intended to replace advice given to you by your health care provider. Make sure you discuss any questions you have with your health care provider.  

## 2013-12-13 LAB — DRUG SCREEN, URINE, NO CONFIRMATION
AMPHETAMINE SCRN UR: NEGATIVE
BENZODIAZEPINES.: NEGATIVE
Barbiturate Quant, Ur: NEGATIVE
COCAINE METABOLITES: NEGATIVE
CREATININE, U: 96.2 mg/dL
Marijuana Metabolite: NEGATIVE
Methadone: NEGATIVE
Opiate Screen, Urine: NEGATIVE
Phencyclidine (PCP): NEGATIVE
Propoxyphene: NEGATIVE

## 2013-12-13 LAB — URINALYSIS, ROUTINE W REFLEX MICROSCOPIC
Bilirubin Urine: NEGATIVE
Glucose, UA: NEGATIVE mg/dL
Hgb urine dipstick: NEGATIVE
Ketones, ur: NEGATIVE mg/dL
LEUKOCYTES UA: NEGATIVE
Nitrite: NEGATIVE
PH: 7.5 (ref 5.0–8.0)
Protein, ur: NEGATIVE mg/dL
SPECIFIC GRAVITY, URINE: 1.018 (ref 1.005–1.030)
UROBILINOGEN UA: 1 mg/dL (ref 0.0–1.0)

## 2013-12-13 LAB — CBC
HEMATOCRIT: 36.1 % (ref 36.0–46.0)
HEMOGLOBIN: 12.3 g/dL (ref 12.0–15.0)
MCH: 31.3 pg (ref 26.0–34.0)
MCHC: 34.1 g/dL (ref 30.0–36.0)
MCV: 91.9 fL (ref 78.0–100.0)
MPV: 10.3 fL (ref 9.4–12.4)
Platelets: 187 10*3/uL (ref 150–400)
RBC: 3.93 MIL/uL (ref 3.87–5.11)
RDW: 12.9 % (ref 11.5–15.5)
WBC: 8.1 10*3/uL (ref 4.0–10.5)

## 2013-12-13 LAB — ABO AND RH: Rh Type: POSITIVE

## 2013-12-13 LAB — VARICELLA ZOSTER ANTIBODY, IGG: VARICELLA IGG: 851.4 {index} — AB (ref <135.00)

## 2013-12-13 LAB — HIV ANTIBODY (ROUTINE TESTING W REFLEX): HIV: NONREACTIVE

## 2013-12-13 LAB — RPR

## 2013-12-13 LAB — URINE CULTURE
Colony Count: NO GROWTH
Organism ID, Bacteria: NO GROWTH

## 2013-12-13 LAB — GC/CHLAMYDIA PROBE AMP
CT PROBE, AMP APTIMA: NEGATIVE
GC Probe RNA: NEGATIVE

## 2013-12-13 LAB — OXYCODONE SCREEN, UA, RFLX CONFIRM: OXYCODONE SCRN UR: NEGATIVE ng/mL

## 2013-12-13 LAB — RUBELLA SCREEN: RUBELLA: 3.1 {index} — AB (ref <0.90)

## 2013-12-13 LAB — HEPATITIS B SURFACE ANTIGEN: HEP B S AG: NEGATIVE

## 2013-12-13 LAB — ANTIBODY SCREEN: Antibody Screen: NEGATIVE

## 2013-12-14 LAB — CYSTIC FIBROSIS DIAGNOSTIC STUDY

## 2014-01-06 ENCOUNTER — Ambulatory Visit (INDEPENDENT_AMBULATORY_CARE_PROVIDER_SITE_OTHER): Payer: Medicaid Other | Admitting: Obstetrics & Gynecology

## 2014-01-06 ENCOUNTER — Encounter: Payer: Self-pay | Admitting: Obstetrics & Gynecology

## 2014-01-06 ENCOUNTER — Ambulatory Visit (INDEPENDENT_AMBULATORY_CARE_PROVIDER_SITE_OTHER): Payer: Medicaid Other

## 2014-01-06 VITALS — BP 90/50 | Wt 114.2 lb

## 2014-01-06 DIAGNOSIS — Z331 Pregnant state, incidental: Secondary | ICD-10-CM

## 2014-01-06 DIAGNOSIS — Z3491 Encounter for supervision of normal pregnancy, unspecified, first trimester: Secondary | ICD-10-CM

## 2014-01-06 DIAGNOSIS — Z3682 Encounter for antenatal screening for nuchal translucency: Secondary | ICD-10-CM

## 2014-01-06 DIAGNOSIS — Z36 Encounter for antenatal screening of mother: Secondary | ICD-10-CM

## 2014-01-06 DIAGNOSIS — Z1389 Encounter for screening for other disorder: Secondary | ICD-10-CM

## 2014-01-06 DIAGNOSIS — Z3481 Encounter for supervision of other normal pregnancy, first trimester: Secondary | ICD-10-CM

## 2014-01-06 DIAGNOSIS — O34219 Maternal care for unspecified type scar from previous cesarean delivery: Secondary | ICD-10-CM

## 2014-01-06 LAB — POCT URINALYSIS DIPSTICK
Blood, UA: NEGATIVE
Glucose, UA: NEGATIVE
KETONES UA: NEGATIVE
LEUKOCYTES UA: NEGATIVE
Nitrite, UA: NEGATIVE
Protein, UA: NEGATIVE

## 2014-01-06 NOTE — Progress Notes (Signed)
Sonogram is normal report done Start baby ASA today  G2P1001 7355w5d Estimated Date of Delivery: 07/23/14  Blood pressure 90/50, weight 114 lb 3.2 oz (51.801 kg), last menstrual period 10/08/2013.   BP weight and urine results all reviewed and noted.  Please refer to the obstetrical flow sheet for the fundal height and fetal heart rate documentation:  Patient reports good fetal movement, denies any bleeding and no rupture of membranes symptoms or regular contractions. Patient is without complaints. All questions were answered.  Plan:  Continued routine obstetrical care,   Follow up in 4 weeks for OB appointment, 2nd IT

## 2014-01-06 NOTE — Addendum Note (Signed)
Addended by: Criss AlvinePULLIAM, Treydon Henricks G on: 01/06/2014 10:55 AM   Modules accepted: Orders

## 2014-01-06 NOTE — Progress Notes (Signed)
U/S(11+5wks)-single active fetus, meas c/w dates, fluid wnl, cx appears closed 3.1cm, posterior Gr 0 placenta, bilateral adnexa appears WNL, NB present, NT-1.5946mm

## 2014-01-11 LAB — MATERNAL SCREEN, INTEGRATED #1

## 2014-02-01 ENCOUNTER — Telehealth: Payer: Self-pay | Admitting: Women's Health

## 2014-02-01 NOTE — Telephone Encounter (Signed)
Pt states that while having a nap this afternoon she rolled over then got a bad pain in her lower right side of stomach.  Pt denies any bleeding and states pain is not constant.  Pt states she has an appointment scheduled here tomorrow.  Advised pt to take Tylenol, push fluids and keep appointment for tomorrow.  Pt verbalized understanding.

## 2014-02-02 ENCOUNTER — Encounter: Payer: Self-pay | Admitting: Advanced Practice Midwife

## 2014-02-02 ENCOUNTER — Ambulatory Visit (INDEPENDENT_AMBULATORY_CARE_PROVIDER_SITE_OTHER): Payer: Medicaid Other | Admitting: Advanced Practice Midwife

## 2014-02-02 VITALS — BP 88/50 | Wt 121.0 lb

## 2014-02-02 DIAGNOSIS — Z363 Encounter for antenatal screening for malformations: Secondary | ICD-10-CM

## 2014-02-02 DIAGNOSIS — Z369 Encounter for antenatal screening, unspecified: Secondary | ICD-10-CM

## 2014-02-02 DIAGNOSIS — Z3402 Encounter for supervision of normal first pregnancy, second trimester: Secondary | ICD-10-CM

## 2014-02-02 DIAGNOSIS — Z1389 Encounter for screening for other disorder: Secondary | ICD-10-CM

## 2014-02-02 DIAGNOSIS — Z331 Pregnant state, incidental: Secondary | ICD-10-CM

## 2014-02-02 LAB — POCT URINALYSIS DIPSTICK
Glucose, UA: NEGATIVE
Ketones, UA: NEGATIVE
Leukocytes, UA: NEGATIVE
Nitrite, UA: NEGATIVE
PROTEIN UA: NEGATIVE
RBC UA: NEGATIVE

## 2014-02-02 NOTE — Progress Notes (Signed)
Pt states that she had a little pain that started yesterday and is on and off today.

## 2014-02-02 NOTE — Progress Notes (Signed)
G2P1001 7575w4d Estimated Date of Delivery: 07/23/14  Blood pressure 88/50, weight 121 lb (54.885 kg), last menstrual period 10/08/2013.   BP weight and urine results all reviewed and noted.  Please refer to the obstetrical flow sheet for the fundal height and fetal heart rate documentation:  Patient reports good fetal movement, denies any bleeding and no rupture of membranes symptoms or regular contractions. Patient is without complaints. All questions were answered.  Plan:  Continued routine obstetrical care, 2nd IT today  Follow up in 3 weeks for OB appointment, anatomy scan

## 2014-02-08 LAB — MATERNAL SCREEN, INTEGRATED #2
AFP MoM: 1.57
AFP, SERUM MAT SCREEN: 56 ng/mL
CALCULATED GESTATIONAL AGE MAT SCREEN: 15.6
Crown Rump Length: 52.7 mm
ESTRIOL MOM MAT SCREEN: 0.81
Estriol, Free: 0.65 ng/mL
HCG, MOM MAT SCREEN: 1.2
INHIBIN A DIMERIC MAT SCREEN: 365 pg/mL
INHIBIN A MOM MAT SCREEN: 1.86
MSS Down Syndrome: <1:5000 {titer}
NT MoM: 1.15
NUMBER OF FETUSES MAT SCREEN 2: 1
Nuchal Translucency: 1.46 mm
PAPP-A MAT SCREEN: 515 ng/mL
PAPP-A MoM: 0.78
Rish for ONTD: 1:1600 {titer}
hCG, Serum: 53.1 IU/mL

## 2014-02-23 ENCOUNTER — Encounter: Payer: Medicaid Other | Admitting: Advanced Practice Midwife

## 2014-02-23 ENCOUNTER — Other Ambulatory Visit: Payer: Medicaid Other

## 2014-02-27 ENCOUNTER — Ambulatory Visit (INDEPENDENT_AMBULATORY_CARE_PROVIDER_SITE_OTHER): Payer: Medicaid Other | Admitting: Women's Health

## 2014-02-27 ENCOUNTER — Encounter: Payer: Self-pay | Admitting: Women's Health

## 2014-02-27 ENCOUNTER — Ambulatory Visit (INDEPENDENT_AMBULATORY_CARE_PROVIDER_SITE_OTHER): Payer: Medicaid Other

## 2014-02-27 ENCOUNTER — Other Ambulatory Visit: Payer: Self-pay | Admitting: Advanced Practice Midwife

## 2014-02-27 VITALS — BP 96/54 | Wt 124.0 lb

## 2014-02-27 DIAGNOSIS — Z98891 History of uterine scar from previous surgery: Secondary | ICD-10-CM

## 2014-02-27 DIAGNOSIS — Z36 Encounter for antenatal screening of mother: Secondary | ICD-10-CM

## 2014-02-27 DIAGNOSIS — O09292 Supervision of pregnancy with other poor reproductive or obstetric history, second trimester: Secondary | ICD-10-CM

## 2014-02-27 DIAGNOSIS — Z363 Encounter for antenatal screening for malformations: Secondary | ICD-10-CM

## 2014-02-27 DIAGNOSIS — O34219 Maternal care for unspecified type scar from previous cesarean delivery: Secondary | ICD-10-CM

## 2014-02-27 DIAGNOSIS — Z8759 Personal history of other complications of pregnancy, childbirth and the puerperium: Secondary | ICD-10-CM

## 2014-02-27 DIAGNOSIS — Z3492 Encounter for supervision of normal pregnancy, unspecified, second trimester: Secondary | ICD-10-CM

## 2014-02-27 DIAGNOSIS — Z331 Pregnant state, incidental: Secondary | ICD-10-CM

## 2014-02-27 DIAGNOSIS — Z1389 Encounter for screening for other disorder: Secondary | ICD-10-CM

## 2014-02-27 LAB — POCT URINALYSIS DIPSTICK
Blood, UA: NEGATIVE
Glucose, UA: NEGATIVE
Ketones, UA: NEGATIVE
Leukocytes, UA: NEGATIVE
Nitrite, UA: NEGATIVE
Protein, UA: NEGATIVE

## 2014-02-27 NOTE — Progress Notes (Signed)
U/S(19+1wks)-active fetus, meas c/w dates, fluid WNL, posterior Gr 0 placenta, cx appears closed (3.6 cm), bilateral adnexa appears WNL, FHR- 148 bpm, no major abnl noted although unable to view cardiac OFT's on today's exam, female fetus

## 2014-02-27 NOTE — Patient Instructions (Signed)
Second Trimester of Pregnancy The second trimester is from week 13 through week 28, months 4 through 6. The second trimester is often a time when you feel your best. Your body has also adjusted to being pregnant, and you begin to feel better physically. Usually, morning sickness has lessened or quit completely, you may have more energy, and you may have an increase in appetite. The second trimester is also a time when the fetus is growing rapidly. At the end of the sixth month, the fetus is about 9 inches long and weighs about 1 pounds. You will likely begin to feel the baby move (quickening) between 18 and 20 weeks of the pregnancy. BODY CHANGES Your body goes through many changes during pregnancy. The changes vary from woman to woman.   Your weight will continue to increase. You will notice your lower abdomen bulging out.  You may begin to get stretch marks on your hips, abdomen, and breasts.  You may develop headaches that can be relieved by medicines approved by your health care provider.  You may urinate more often because the fetus is pressing on your bladder.  You may develop or continue to have heartburn as a result of your pregnancy.  You may develop constipation because certain hormones are causing the muscles that push waste through your intestines to slow down.  You may develop hemorrhoids or swollen, bulging veins (varicose veins).  You may have back pain because of the weight gain and pregnancy hormones relaxing your joints between the bones in your pelvis and as a result of a shift in weight and the muscles that support your balance.  Your breasts will continue to grow and be tender.  Your gums may bleed and may be sensitive to brushing and flossing.  Dark spots or blotches (chloasma, mask of pregnancy) may develop on your face. This will likely fade after the baby is born.  A dark line from your belly button to the pubic area (linea nigra) may appear. This will likely fade  after the baby is born.  You may have changes in your hair. These can include thickening of your hair, rapid growth, and changes in texture. Some women also have hair loss during or after pregnancy, or hair that feels dry or thin. Your hair will most likely return to normal after your baby is born. WHAT TO EXPECT AT YOUR PRENATAL VISITS During a routine prenatal visit:  You will be weighed to make sure you and the fetus are growing normally.  Your blood pressure will be taken.  Your abdomen will be measured to track your baby's growth.  The fetal heartbeat will be listened to.  Any test results from the previous visit will be discussed. Your health care provider may ask you:  How you are feeling.  If you are feeling the baby move.  If you have had any abnormal symptoms, such as leaking fluid, bleeding, severe headaches, or abdominal cramping.  If you have any questions. Other tests that may be performed during your second trimester include:  Blood tests that check for:  Low iron levels (anemia).  Gestational diabetes (between 24 and 28 weeks).  Rh antibodies.  Urine tests to check for infections, diabetes, or protein in the urine.  An ultrasound to confirm the proper growth and development of the baby.  An amniocentesis to check for possible genetic problems.  Fetal screens for spina bifida and Down syndrome. HOME CARE INSTRUCTIONS   Avoid all smoking, herbs, alcohol, and unprescribed   drugs. These chemicals affect the formation and growth of the baby.  Follow your health care provider's instructions regarding medicine use. There are medicines that are either safe or unsafe to take during pregnancy.  Exercise only as directed by your health care provider. Experiencing uterine cramps is a good sign to stop exercising.  Continue to eat regular, healthy meals.  Wear a good support bra for breast tenderness.  Do not use hot tubs, steam rooms, or saunas.  Wear your  seat belt at all times when driving.  Avoid raw meat, uncooked cheese, cat litter boxes, and soil used by cats. These carry germs that can cause birth defects in the baby.  Take your prenatal vitamins.  Try taking a stool softener (if your health care provider approves) if you develop constipation. Eat more high-fiber foods, such as fresh vegetables or fruit and whole grains. Drink plenty of fluids to keep your urine clear or pale yellow.  Take warm sitz baths to soothe any pain or discomfort caused by hemorrhoids. Use hemorrhoid cream if your health care provider approves.  If you develop varicose veins, wear support hose. Elevate your feet for 15 minutes, 3-4 times a day. Limit salt in your diet.  Avoid heavy lifting, wear low heel shoes, and practice good posture.  Rest with your legs elevated if you have leg cramps or low back pain.  Visit your dentist if you have not gone yet during your pregnancy. Use a soft toothbrush to brush your teeth and be gentle when you floss.  A sexual relationship may be continued unless your health care provider directs you otherwise.  Continue to go to all your prenatal visits as directed by your health care provider. SEEK MEDICAL CARE IF:   You have dizziness.  You have mild pelvic cramps, pelvic pressure, or nagging pain in the abdominal area.  You have persistent nausea, vomiting, or diarrhea.  You have a bad smelling vaginal discharge.  You have pain with urination. SEEK IMMEDIATE MEDICAL CARE IF:   You have a fever.  You are leaking fluid from your vagina.  You have spotting or bleeding from your vagina.  You have severe abdominal cramping or pain.  You have rapid weight gain or loss.  You have shortness of breath with chest pain.  You notice sudden or extreme swelling of your face, hands, ankles, feet, or legs.  You have not felt your baby move in over an hour.  You have severe headaches that do not go away with  medicine.  You have vision changes. Document Released: 12/31/2000 Document Revised: 01/11/2013 Document Reviewed: 03/09/2012 Exit[employer] Patient Information 2015 [employer]. This information is not intended to replace advice given to you by your health care provider. Make sure you discuss any questions you have with your health care provider.  

## 2014-02-27 NOTE — Progress Notes (Signed)
Low-risk OB appointment G2P1001 1767w1d Estimated Date of Delivery: 07/23/14 BP 96/54 mmHg  Wt 124 lb (56.246 kg)  LMP 10/08/2013 (Approximate)  BP, weight, and urine reviewed.  Refer to obstetrical flow sheet for FH & FHR.  Reports good fm.  Denies regular uc's, lof, vb, or uti s/s. No complaints. Wants RLTCS.  Reviewed today's normal anatomy u/s- unable to view OFTs- will recheck at 28wks. Discussed ptl s/s, fm. Plan:  Continue routine obstetrical care  F/U in 4wks for OB appointment

## 2014-02-27 NOTE — Progress Notes (Signed)
Pt denies any problems or concerns at this time.  

## 2014-03-29 ENCOUNTER — Ambulatory Visit (INDEPENDENT_AMBULATORY_CARE_PROVIDER_SITE_OTHER): Payer: Medicaid Other | Admitting: Women's Health

## 2014-03-29 VITALS — BP 100/52 | Wt 138.0 lb

## 2014-03-29 DIAGNOSIS — Z363 Encounter for antenatal screening for malformations: Secondary | ICD-10-CM

## 2014-03-29 DIAGNOSIS — Z1389 Encounter for screening for other disorder: Secondary | ICD-10-CM

## 2014-03-29 DIAGNOSIS — Z331 Pregnant state, incidental: Secondary | ICD-10-CM

## 2014-03-29 DIAGNOSIS — Z3492 Encounter for supervision of normal pregnancy, unspecified, second trimester: Secondary | ICD-10-CM

## 2014-03-29 LAB — POCT URINALYSIS DIPSTICK
Glucose, UA: NEGATIVE
Ketones, UA: NEGATIVE
Leukocytes, UA: NEGATIVE
Nitrite, UA: NEGATIVE
Protein, UA: NEGATIVE
RBC UA: NEGATIVE

## 2014-03-29 NOTE — Progress Notes (Signed)
Low-risk OB appointment G2P1001 6584w3d Estimated Date of Delivery: 07/23/14 BP 100/52 mmHg  Wt 138 lb (62.596 kg)  LMP 10/08/2013 (Approximate)  BP, weight, and urine reviewed.  Refer to obstetrical flow sheet for FH & FHR.  Reports good fm.  Denies regular uc's, lof, vb, or uti s/s. No complaints. Reviewed ptl s/s, fm. Plan:  Continue routine obstetrical care  F/U in 4wks for OB appointment, pn2, and f/u anatomy u/s

## 2014-03-29 NOTE — Patient Instructions (Signed)
You will have your sugar test next visit.  Please do not eat or drink anything after midnight the night before you come, not even water.  You will be here for at least two hours.     Call the office ([phone]) or go to [facility] if:  You begin to have strong, frequent contractions  Your water breaks.  Sometimes it is a big gush of fluid, sometimes it is just a trickle that keeps getting your panties wet or running down your legs  You have vaginal bleeding.  It is normal to have a small amount of spotting if your cervix was checked.   You don't feel your baby moving like normal.  If you don't, get you something to eat and drink and lay down and focus on feeling your baby move.   If your baby is still not moving like normal, you should call the office or go to [facility].  Second Trimester of Pregnancy The second trimester is from week 13 through week 28, months 4 through 6. The second trimester is often a time when you feel your best. Your body has also adjusted to being pregnant, and you begin to feel better physically. Usually, morning sickness has lessened or quit completely, you may have more energy, and you may have an increase in appetite. The second trimester is also a time when the fetus is growing rapidly. At the end of the sixth month, the fetus is about 9 inches long and weighs about 1 pounds. You will likely begin to feel the baby move (quickening) between 18 and 20 weeks of the pregnancy. BODY CHANGES Your body goes through many changes during pregnancy. The changes vary from woman to woman.   Your weight will continue to increase. You will notice your lower abdomen bulging out.  You may begin to get stretch marks on your hips, abdomen, and breasts.  You may develop headaches that can be relieved by medicines approved by your health care provider.  You may urinate more often because the fetus is pressing on your bladder.  You may develop or continue to have  heartburn as a result of your pregnancy.  You may develop constipation because certain hormones are causing the muscles that push waste through your intestines to slow down.  You may develop hemorrhoids or swollen, bulging veins (varicose veins).  You may have back pain because of the weight gain and pregnancy hormones relaxing your joints between the bones in your pelvis and as a result of a shift in weight and the muscles that support your balance.  Your breasts will continue to grow and be tender.  Your gums may bleed and may be sensitive to brushing and flossing.  Dark spots or blotches (chloasma, mask of pregnancy) may develop on your face. This will likely fade after the baby is born.  A dark line from your belly button to the pubic area (linea nigra) may appear. This will likely fade after the baby is born.  You may have changes in your hair. These can include thickening of your hair, rapid growth, and changes in texture. Some women also have hair loss during or after pregnancy, or hair that feels dry or thin. Your hair will most likely return to normal after your baby is born. WHAT TO EXPECT AT YOUR PRENATAL VISITS During a routine prenatal visit:  You will be weighed to make sure you and the fetus are growing normally.  Your blood pressure will be taken.    Your abdomen will be measured to track your baby's growth.  The fetal heartbeat will be listened to.  Any test results from the previous visit will be discussed. Your health care provider may ask you:  How you are feeling.  If you are feeling the baby move.  If you have had any abnormal symptoms, such as leaking fluid, bleeding, severe headaches, or abdominal cramping.  If you have any questions. Other tests that may be performed during your second trimester include:  Blood tests that check for:  Low iron levels (anemia).  Gestational diabetes (between 24 and 28 weeks).  Rh antibodies.  Urine tests to check  for infections, diabetes, or protein in the urine.  An ultrasound to confirm the proper growth and development of the baby.  An amniocentesis to check for possible genetic problems.  Fetal screens for spina bifida and Down syndrome. HOME CARE INSTRUCTIONS   Avoid all smoking, herbs, alcohol, and unprescribed drugs. These chemicals affect the formation and growth of the baby.  Follow your health care provider's instructions regarding medicine use. There are medicines that are either safe or unsafe to take during pregnancy.  Exercise only as directed by your health care provider. Experiencing uterine cramps is a good sign to stop exercising.  Continue to eat regular, healthy meals.  Wear a good support bra for breast tenderness.  Do not use hot tubs, steam rooms, or saunas.  Wear your seat belt at all times when driving.  Avoid raw meat, uncooked cheese, cat litter boxes, and soil used by cats. These carry germs that can cause birth defects in the baby.  Take your prenatal vitamins.  Try taking a stool softener (if your health care provider approves) if you develop constipation. Eat more high-fiber foods, such as fresh vegetables or fruit and whole grains. Drink plenty of fluids to keep your urine clear or pale yellow.  Take warm sitz baths to soothe any pain or discomfort caused by hemorrhoids. Use hemorrhoid cream if your health care provider approves.  If you develop varicose veins, wear support hose. Elevate your feet for 15 minutes, 3-4 times a day. Limit salt in your diet.  Avoid heavy lifting, wear low heel shoes, and practice good posture.  Rest with your legs elevated if you have leg cramps or low back pain.  Visit your dentist if you have not gone yet during your pregnancy. Use a soft toothbrush to brush your teeth and be gentle when you floss.  A sexual relationship may be continued unless your health care provider directs you otherwise.  Continue to go to all your  prenatal visits as directed by your health care provider. SEEK MEDICAL CARE IF:   You have dizziness.  You have mild pelvic cramps, pelvic pressure, or nagging pain in the abdominal area.  You have persistent nausea, vomiting, or diarrhea.  You have a bad smelling vaginal discharge.  You have pain with urination. SEEK IMMEDIATE MEDICAL CARE IF:   You have a fever.  You are leaking fluid from your vagina.  You have spotting or bleeding from your vagina.  You have severe abdominal cramping or pain.  You have rapid weight gain or loss.  You have shortness of breath with chest pain.  You notice sudden or extreme swelling of your face, hands, ankles, feet, or legs.  You have not felt your baby move in over an hour.  You have severe headaches that do not go away with medicine.  You have vision changes.   Document Released: 12/31/2000 Document Revised: 01/11/2013 Document Reviewed: 03/09/2012 [employer] Patient Information 2015 [employer]. This information is not intended to replace advice given to you by your health care provider. Make sure you discuss any questions you have with your health care provider.     

## 2014-04-26 ENCOUNTER — Ambulatory Visit (INDEPENDENT_AMBULATORY_CARE_PROVIDER_SITE_OTHER): Payer: Medicaid Other | Admitting: Women's Health

## 2014-04-26 ENCOUNTER — Other Ambulatory Visit: Payer: Medicaid Other

## 2014-04-26 ENCOUNTER — Ambulatory Visit (INDEPENDENT_AMBULATORY_CARE_PROVIDER_SITE_OTHER): Payer: Medicaid Other

## 2014-04-26 ENCOUNTER — Encounter: Payer: Self-pay | Admitting: Women's Health

## 2014-04-26 VITALS — BP 92/58 | HR 76 | Wt 144.0 lb

## 2014-04-26 DIAGNOSIS — Z36 Encounter for antenatal screening of mother: Secondary | ICD-10-CM

## 2014-04-26 DIAGNOSIS — Z363 Encounter for antenatal screening for malformations: Secondary | ICD-10-CM

## 2014-04-26 DIAGNOSIS — O34219 Maternal care for unspecified type scar from previous cesarean delivery: Secondary | ICD-10-CM

## 2014-04-26 DIAGNOSIS — Z331 Pregnant state, incidental: Secondary | ICD-10-CM

## 2014-04-26 DIAGNOSIS — Z113 Encounter for screening for infections with a predominantly sexual mode of transmission: Secondary | ICD-10-CM

## 2014-04-26 DIAGNOSIS — Z1389 Encounter for screening for other disorder: Secondary | ICD-10-CM

## 2014-04-26 DIAGNOSIS — Z131 Encounter for screening for diabetes mellitus: Secondary | ICD-10-CM

## 2014-04-26 DIAGNOSIS — Z3492 Encounter for supervision of normal pregnancy, unspecified, second trimester: Secondary | ICD-10-CM

## 2014-04-26 DIAGNOSIS — Z3483 Encounter for supervision of other normal pregnancy, third trimester: Secondary | ICD-10-CM

## 2014-04-26 DIAGNOSIS — Z114 Encounter for screening for human immunodeficiency virus [HIV]: Secondary | ICD-10-CM

## 2014-04-26 DIAGNOSIS — Z0184 Encounter for antibody response examination: Secondary | ICD-10-CM

## 2014-04-26 LAB — POCT URINALYSIS DIPSTICK
Blood, UA: NEGATIVE
GLUCOSE UA: NEGATIVE
KETONES UA: NEGATIVE
LEUKOCYTES UA: NEGATIVE
Nitrite, UA: NEGATIVE

## 2014-04-26 NOTE — Progress Notes (Signed)
Low-risk OB appointment G2P1001 8531w3d Estimated Date of Delivery: 07/23/14 BP 92/58 mmHg  Pulse 76  Wt 144 lb (65.318 kg)  LMP 10/08/2013 (Approximate)  BP, weight, and urine reviewed.  Refer to obstetrical flow sheet for FH & FHR.  Reports good fm.  Denies regular uc's, lof, vb, or uti s/s. No complaints. Has f/u anatomy u/s @ 1030 to assess OFTs.  Reviewed ptl s/s, fkc. Recommended Tdap at HD/PCP per CDC guidelines.  Plan:  Continue routine obstetrical care  F/U in 4wks for OB appointment  PN2 today

## 2014-04-26 NOTE — Patient Instructions (Signed)

## 2014-04-26 NOTE — Progress Notes (Signed)
Follow up ultrasound today to recheck fetal heart.  Single, active female fetus in a cephalic presentation.  FHR 138 bpm. Fluid is normal with SVP of 5.56 cm.  Posterior Gr 1 placenta.  Bilateral ovaries appear normal. Outflow tracts visualized today and appear normal. EFW today of 1149g, which is consistent with dating (59%).

## 2014-04-27 LAB — CBC
HCT: 33.2 % — ABNORMAL LOW (ref 34.0–46.6)
Hemoglobin: 11.8 g/dL (ref 11.1–15.9)
MCH: 33.6 pg — AB (ref 26.6–33.0)
MCHC: 35.5 g/dL (ref 31.5–35.7)
MCV: 95 fL (ref 79–97)
PLATELETS: 243 10*3/uL (ref 150–379)
RBC: 3.51 x10E6/uL — ABNORMAL LOW (ref 3.77–5.28)
RDW: 12.1 % — ABNORMAL LOW (ref 12.3–15.4)
WBC: 9.7 10*3/uL (ref 3.4–10.8)

## 2014-04-27 LAB — HSV 2 ANTIBODY, IGG

## 2014-04-27 LAB — ANTIBODY SCREEN: Antibody Screen: NEGATIVE

## 2014-04-27 LAB — GLUCOSE TOLERANCE, 2 HOURS W/ 1HR
GLUCOSE, FASTING: 90 mg/dL (ref 65–91)
Glucose, 1 hour: 156 mg/dL (ref 65–179)
Glucose, 2 hour: 105 mg/dL (ref 65–152)

## 2014-04-27 LAB — HIV ANTIBODY (ROUTINE TESTING W REFLEX): HIV Screen 4th Generation wRfx: NONREACTIVE

## 2014-04-27 LAB — RPR: RPR: NONREACTIVE

## 2014-05-24 ENCOUNTER — Encounter: Payer: Self-pay | Admitting: Advanced Practice Midwife

## 2014-05-24 ENCOUNTER — Ambulatory Visit (INDEPENDENT_AMBULATORY_CARE_PROVIDER_SITE_OTHER): Payer: Medicaid Other | Admitting: Advanced Practice Midwife

## 2014-05-24 VITALS — BP 110/70 | HR 72 | Wt 152.0 lb

## 2014-05-24 DIAGNOSIS — Z331 Pregnant state, incidental: Secondary | ICD-10-CM

## 2014-05-24 DIAGNOSIS — Z3493 Encounter for supervision of normal pregnancy, unspecified, third trimester: Secondary | ICD-10-CM

## 2014-05-24 DIAGNOSIS — Z1389 Encounter for screening for other disorder: Secondary | ICD-10-CM

## 2014-05-24 LAB — POCT URINALYSIS DIPSTICK
Blood, UA: NEGATIVE
Glucose, UA: NEGATIVE
KETONES UA: NEGATIVE
LEUKOCYTES UA: NEGATIVE
Nitrite, UA: NEGATIVE
PROTEIN UA: NEGATIVE

## 2014-05-24 NOTE — Progress Notes (Signed)
G2P1001 8416w3d Estimated Date of Delivery: 07/23/14  Blood pressure 110/70, pulse 72, weight 152 lb (68.947 kg), last menstrual period 10/08/2013.   BP weight and urine results all reviewed and noted.  Please refer to the obstetrical flow sheet for the fundal height and fetal heart rate documentation:  Patient reports good fetal movement, denies any bleeding and no rupture of membranes symptoms or regular contractions. Patient is without complaints. All questions were answered.  Plan:  Continued routine obstetrical care,   Follow up in 2 weeks for OB appointment,

## 2014-06-07 ENCOUNTER — Ambulatory Visit (INDEPENDENT_AMBULATORY_CARE_PROVIDER_SITE_OTHER): Payer: Medicaid Other | Admitting: Advanced Practice Midwife

## 2014-06-07 ENCOUNTER — Encounter: Payer: Self-pay | Admitting: Advanced Practice Midwife

## 2014-06-07 VITALS — BP 90/60 | HR 76 | Wt 156.0 lb

## 2014-06-07 DIAGNOSIS — Z331 Pregnant state, incidental: Secondary | ICD-10-CM

## 2014-06-07 DIAGNOSIS — Z1389 Encounter for screening for other disorder: Secondary | ICD-10-CM

## 2014-06-07 DIAGNOSIS — Z3493 Encounter for supervision of normal pregnancy, unspecified, third trimester: Secondary | ICD-10-CM

## 2014-06-07 LAB — POCT URINALYSIS DIPSTICK
Glucose, UA: NEGATIVE
KETONES UA: NEGATIVE
Leukocytes, UA: NEGATIVE
Nitrite, UA: NEGATIVE
PROTEIN UA: NEGATIVE
RBC UA: NEGATIVE

## 2014-06-07 NOTE — Progress Notes (Signed)
G2P1001 2719w3d Estimated Date of Delivery: 07/23/14  Blood pressure 90/60, pulse 76, weight 156 lb (70.761 kg), last menstrual period 10/08/2013.   BP weight and urine results all reviewed and noted.  Please refer to the obstetrical flow sheet for the fundal height and fetal heart rate documentation:  Patient reports good fetal movement, denies any bleeding and no rupture of membranes symptoms or regular contractions. Patient is without complaints. All questions were answered.  Plan:  Continued routine obstetrical care,   Follow up in 2 weeks for OB appointment,

## 2014-06-07 NOTE — Addendum Note (Signed)
Addended by: Richardson ChiquitoRAVIS, ASHLEY M on: 06/07/2014 09:37 AM   Modules accepted: Orders

## 2014-06-13 ENCOUNTER — Ambulatory Visit: Payer: Medicaid Other | Admitting: Adult Health

## 2014-06-13 ENCOUNTER — Other Ambulatory Visit: Payer: Medicaid Other

## 2014-06-21 ENCOUNTER — Ambulatory Visit (INDEPENDENT_AMBULATORY_CARE_PROVIDER_SITE_OTHER): Payer: Medicaid Other | Admitting: Advanced Practice Midwife

## 2014-06-21 ENCOUNTER — Encounter: Payer: Self-pay | Admitting: Advanced Practice Midwife

## 2014-06-21 VITALS — BP 110/60 | HR 88 | Wt 163.0 lb

## 2014-06-21 DIAGNOSIS — Z331 Pregnant state, incidental: Secondary | ICD-10-CM

## 2014-06-21 DIAGNOSIS — Z3493 Encounter for supervision of normal pregnancy, unspecified, third trimester: Secondary | ICD-10-CM

## 2014-06-21 DIAGNOSIS — Z1389 Encounter for screening for other disorder: Secondary | ICD-10-CM

## 2014-06-21 LAB — POCT URINALYSIS DIPSTICK
Glucose, UA: NEGATIVE
KETONES UA: NEGATIVE
Leukocytes, UA: NEGATIVE
Nitrite, UA: NEGATIVE
Protein, UA: NEGATIVE
RBC UA: NEGATIVE

## 2014-06-21 NOTE — Progress Notes (Signed)
G2P1001 9526w3d Estimated Date of Delivery: 07/23/14  Blood pressure 110/60, pulse 88, weight 163 lb (73.936 kg), last menstrual period 10/08/2013.   BP weight and urine results all reviewed and noted.  Please refer to the obstetrical flow sheet for the fundal height and fetal heart rate documentation:  Patient reports good fetal movement, denies any bleeding and no rupture of membranes symptoms or regular contractions. Patient is without complaints. All questions were answered.  Plan:  Continued routine obstetrical care,   Follow up in 1 weeks for OB appointment, GBS

## 2014-06-28 ENCOUNTER — Ambulatory Visit (INDEPENDENT_AMBULATORY_CARE_PROVIDER_SITE_OTHER): Payer: Medicaid Other | Admitting: Obstetrics & Gynecology

## 2014-06-28 ENCOUNTER — Encounter: Payer: Self-pay | Admitting: Obstetrics & Gynecology

## 2014-06-28 VITALS — BP 118/60 | HR 88 | Wt 164.0 lb

## 2014-06-28 DIAGNOSIS — Z1389 Encounter for screening for other disorder: Secondary | ICD-10-CM

## 2014-06-28 DIAGNOSIS — Z331 Pregnant state, incidental: Secondary | ICD-10-CM

## 2014-06-28 DIAGNOSIS — Z3493 Encounter for supervision of normal pregnancy, unspecified, third trimester: Secondary | ICD-10-CM

## 2014-06-28 LAB — POCT URINALYSIS DIPSTICK
Blood, UA: NEGATIVE
Glucose, UA: NEGATIVE
Ketones, UA: NEGATIVE
Leukocytes, UA: NEGATIVE
Nitrite, UA: NEGATIVE
Protein, UA: NEGATIVE

## 2014-06-28 NOTE — Progress Notes (Signed)
G2P1001 1941w3d Estimated Date of Delivery: 07/23/14  Blood pressure 118/60, pulse 88, weight 164 lb (74.39 kg), last menstrual period 10/08/2013.   BP weight and urine results all reviewed and noted.  Please refer to the obstetrical flow sheet for the fundal height and fetal heart rate documentation:  Patient reports good fetal movement, denies any bleeding and no rupture of membranes symptoms or regular contractions. Patient is without complaints. All questions were answered.  Plan:  Continued routine obstetrical care,   Follow up in 1 weeks for OB appointment,

## 2014-07-05 ENCOUNTER — Ambulatory Visit (INDEPENDENT_AMBULATORY_CARE_PROVIDER_SITE_OTHER): Payer: Medicaid Other | Admitting: Obstetrics & Gynecology

## 2014-07-05 ENCOUNTER — Encounter: Payer: Self-pay | Admitting: Obstetrics & Gynecology

## 2014-07-05 VITALS — BP 114/64 | HR 80 | Wt 167.0 lb

## 2014-07-05 DIAGNOSIS — Z36 Encounter for antenatal screening of mother: Secondary | ICD-10-CM

## 2014-07-05 DIAGNOSIS — Z3493 Encounter for supervision of normal pregnancy, unspecified, third trimester: Secondary | ICD-10-CM

## 2014-07-05 DIAGNOSIS — Z369 Encounter for antenatal screening, unspecified: Secondary | ICD-10-CM

## 2014-07-05 DIAGNOSIS — Z331 Pregnant state, incidental: Secondary | ICD-10-CM

## 2014-07-05 DIAGNOSIS — Z1389 Encounter for screening for other disorder: Secondary | ICD-10-CM

## 2014-07-05 LAB — POCT URINALYSIS DIPSTICK
Blood, UA: NEGATIVE
GLUCOSE UA: NEGATIVE
Ketones, UA: NEGATIVE
LEUKOCYTES UA: NEGATIVE
NITRITE UA: NEGATIVE
Protein, UA: NEGATIVE

## 2014-07-05 NOTE — Progress Notes (Signed)
G2P1001 [redacted]w[redacted]d Estimated Date of Delivery: 07/23/14  Blood pressure 114/64, pulse 80, weight 167 lb (75.751 kg), last menstrual period 10/08/2013.   BP weight and urine results all reviewed and noted.  Please refer to the obstetrical flow sheet for the fundal height and fetal heart rate documentation:  Patient reports good fetal movement, denies any bleeding and no rupture of membranes symptoms or regular contractions. Patient is without complaints. All questions were answered.  Plan:  Continued routine obstetrical care,   Follow up in 1 weeks for OB appointment,

## 2014-07-05 NOTE — Addendum Note (Signed)
Addended by: Richardson Chiquito on: 07/05/2014 04:03 PM   Modules accepted: Orders

## 2014-07-07 LAB — GC/CHLAMYDIA PROBE AMP
CHLAMYDIA, DNA PROBE: NEGATIVE
Neisseria gonorrhoeae by PCR: NEGATIVE

## 2014-07-09 LAB — CULTURE, BETA STREP (GROUP B ONLY): Strep Gp B Culture: NEGATIVE

## 2014-07-12 ENCOUNTER — Ambulatory Visit (INDEPENDENT_AMBULATORY_CARE_PROVIDER_SITE_OTHER): Payer: Medicaid Other | Admitting: Obstetrics & Gynecology

## 2014-07-12 VITALS — BP 94/52 | HR 80 | Wt 168.0 lb

## 2014-07-12 DIAGNOSIS — Z1389 Encounter for screening for other disorder: Secondary | ICD-10-CM

## 2014-07-12 DIAGNOSIS — Z331 Pregnant state, incidental: Secondary | ICD-10-CM

## 2014-07-12 DIAGNOSIS — Z3493 Encounter for supervision of normal pregnancy, unspecified, third trimester: Secondary | ICD-10-CM

## 2014-07-12 LAB — POCT URINALYSIS DIPSTICK
Glucose, UA: NEGATIVE
Ketones, UA: NEGATIVE
Leukocytes, UA: NEGATIVE
Nitrite, UA: NEGATIVE
PROTEIN UA: NEGATIVE
RBC UA: NEGATIVE

## 2014-07-12 NOTE — Progress Notes (Signed)
G2P1001 [redacted]w[redacted]d Estimated Date of Delivery: 07/23/14  Blood pressure 94/52, pulse 80, weight 168 lb (76.204 kg), last menstrual period 10/08/2013.   BP weight and urine results all reviewed and noted.  Please refer to the obstetrical flow sheet for the fundal height and fetal heart rate documentation:  Patient reports good fetal movement, denies any bleeding and no rupture of membranes symptoms or regular contractions. Patient is without complaints. All questions were answered.  Plan:  Continued routine obstetrical care,   Follow up in 07/25/2014 weeks for OB appointment, post op

## 2014-07-13 NOTE — Anesthesia Preprocedure Evaluation (Addendum)
Anesthesia Evaluation  Patient identified by MRN, date of birth, ID band Patient awake    Reviewed: Allergy & Precautions, H&P , Patient's Chart, lab work & pertinent test results  Airway Mallampati: II  TM Distance: >3 FB Neck ROM: full    Dental no notable dental hx.    Pulmonary former smoker,  breath sounds clear to auscultation  Pulmonary exam normal       Cardiovascular Exercise Tolerance: Good hypertension, Rhythm:regular Rate:Normal     Neuro/Psych    GI/Hepatic   Endo/Other    Renal/GU      Musculoskeletal   Abdominal   Peds  Hematology   Anesthesia Other Findings   Reproductive/Obstetrics                            Anesthesia Physical Anesthesia Plan  ASA: II  Anesthesia Plan: Spinal   Post-op Pain Management:    Induction:   Airway Management Planned:   Additional Equipment:   Intra-op Plan:   Post-operative Plan:   Informed Consent: I have reviewed the patients History and Physical, chart, labs and discussed the procedure including the risks, benefits and alternatives for the proposed anesthesia with the patient or authorized representative who has indicated his/her understanding and acceptance.   Dental Advisory Given  Plan Discussed with: CRNA  Anesthesia Plan Comments: (Lab work confirmed with CRNA in room. Platelets okay. Discussed spinal anesthetic, and patient consents to the procedure:  included risk of possible headache,backache, failed block, allergic reaction, and nerve injury. This patient was asked if she had any questions or concerns before the procedure started. )        Anesthesia Quick Evaluation

## 2014-07-14 ENCOUNTER — Encounter (HOSPITAL_COMMUNITY): Payer: Self-pay

## 2014-07-14 NOTE — Patient Instructions (Addendum)
  Your procedure is scheduled on:07/18/14  Enter through the Main Entrance at :6am Pick up desk phone and dial [phone] and inform us of your arrival.  Please call [phone] if you have any problems the morning of surgery.  Remember: Do not eat food or drink liquids, including water, after midnight:tonight   You may brush your teeth the morning of surgery.   DO NOT wear jewelry, eye make-up, lipstick,body lotion, or dark fingernail polish.  (Polished toes are ok) You may wear deodorant.  If you are to be admitted after surgery, leave suitcase in car until your room has been assigned. Patients discharged on the day of surgery will not be allowed to drive home. Wear loose fitting, comfortable clothes for your ride home.

## 2014-07-17 ENCOUNTER — Encounter (HOSPITAL_COMMUNITY)
Admission: RE | Admit: 2014-07-17 | Discharge: 2014-07-17 | Disposition: A | Discharge status: Home / Self Care | Payer: Medicaid Other | Source: Ambulatory Visit | Attending: Obstetrics & Gynecology | Admitting: Obstetrics & Gynecology

## 2014-07-17 ENCOUNTER — Encounter (HOSPITAL_COMMUNITY): Payer: Self-pay

## 2014-07-17 VITALS — BP 119/88 | HR 79 | Ht 62.0 in | Wt 169.0 lb

## 2014-07-17 DIAGNOSIS — O34219 Maternal care for unspecified type scar from previous cesarean delivery: Secondary | ICD-10-CM

## 2014-07-17 DIAGNOSIS — Z3493 Encounter for supervision of normal pregnancy, unspecified, third trimester: Secondary | ICD-10-CM

## 2014-07-17 HISTORY — DX: Essential (primary) hypertension: I10

## 2014-07-17 HISTORY — DX: Unspecified convulsions: R56.9

## 2014-07-17 HISTORY — DX: Cardiac murmur, unspecified: R01.1

## 2014-07-17 LAB — CBC
HCT: 31.9 % — ABNORMAL LOW (ref 36.0–46.0)
Hemoglobin: 11.2 g/dL — ABNORMAL LOW (ref 12.0–15.0)
MCH: 31.3 pg (ref 26.0–34.0)
MCHC: 35.1 g/dL (ref 30.0–36.0)
MCV: 89.1 fL (ref 78.0–100.0)
PLATELETS: 198 10*3/uL (ref 150–400)
RBC: 3.58 MIL/uL — AB (ref 3.87–5.11)
RDW: 13.1 % (ref 11.5–15.5)
WBC: 9.1 10*3/uL (ref 4.0–10.5)

## 2014-07-17 LAB — TYPE AND SCREEN
ABO/RH(D): B POS
Antibody Screen: NEGATIVE

## 2014-07-17 LAB — ABO/RH: ABO/RH(D): B POS

## 2014-07-18 ENCOUNTER — Inpatient Hospital Stay (HOSPITAL_COMMUNITY): Payer: Medicaid Other | Admitting: Anesthesiology

## 2014-07-18 ENCOUNTER — Encounter (HOSPITAL_COMMUNITY): Payer: Self-pay | Admitting: *Deleted

## 2014-07-18 ENCOUNTER — Inpatient Hospital Stay (HOSPITAL_COMMUNITY)
Admission: RE | Admit: 2014-07-18 | Discharge: 2014-07-20 | DRG: 766 | Disposition: A | Discharge status: Home / Self Care | Payer: Medicaid Other | Source: Ambulatory Visit | Attending: Obstetrics & Gynecology | Admitting: Obstetrics & Gynecology

## 2014-07-18 ENCOUNTER — Encounter (HOSPITAL_COMMUNITY)
Admission: RE | Disposition: A | Discharge status: Home / Self Care | Payer: Self-pay | Source: Ambulatory Visit | Attending: Obstetrics & Gynecology

## 2014-07-18 DIAGNOSIS — O34219 Maternal care for unspecified type scar from previous cesarean delivery: Secondary | ICD-10-CM

## 2014-07-18 DIAGNOSIS — Z3483 Encounter for supervision of other normal pregnancy, third trimester: Secondary | ICD-10-CM | POA: Diagnosis present

## 2014-07-18 DIAGNOSIS — O3421 Maternal care for scar from previous cesarean delivery: Secondary | ICD-10-CM

## 2014-07-18 DIAGNOSIS — Z3A38 38 weeks gestation of pregnancy: Secondary | ICD-10-CM | POA: Diagnosis present

## 2014-07-18 DIAGNOSIS — Z3A39 39 weeks gestation of pregnancy: Secondary | ICD-10-CM

## 2014-07-18 DIAGNOSIS — Z98891 History of uterine scar from previous surgery: Secondary | ICD-10-CM

## 2014-07-18 LAB — RPR: RPR Ser Ql: NONREACTIVE

## 2014-07-18 SURGERY — Surgical Case
Anesthesia: Spinal | Site: Abdomen

## 2014-07-18 MED ORDER — SCOPOLAMINE 1 MG/3DAYS TD PT72
MEDICATED_PATCH | TRANSDERMAL | Status: AC
  Administered 2014-07-18: 1.5 mg via TRANSDERMAL
  Filled 2014-07-18: qty 1

## 2014-07-18 MED ORDER — SIMETHICONE 80 MG PO CHEW: 80.0000 mg | CHEWABLE_TABLET | ORAL | Status: DC | PRN

## 2014-07-18 MED ORDER — ONDANSETRON HCL 4 MG/2ML IJ SOLN
INTRAMUSCULAR | Status: DC | PRN
  Administered 2014-07-18: 4 mg via INTRAVENOUS

## 2014-07-18 MED ORDER — WITCH HAZEL-GLYCERIN EX PADS: 1.0000 "application " | MEDICATED_PAD | CUTANEOUS | Status: DC | PRN

## 2014-07-18 MED ORDER — DIPHENHYDRAMINE HCL 25 MG PO CAPS
25.0000 mg | ORAL_CAPSULE | Freq: Four times a day (QID) | ORAL | Status: DC | PRN
  Administered 2014-07-18: 25 mg via ORAL
  Filled 2014-07-18: qty 1

## 2014-07-18 MED ORDER — FENTANYL CITRATE (PF) 100 MCG/2ML IJ SOLN
INTRAMUSCULAR | Status: DC | PRN
  Administered 2014-07-18: 25 ug via INTRATHECAL

## 2014-07-18 MED ORDER — ACETAMINOPHEN 160 MG/5ML PO SOLN: 975.0000 mg | Freq: Once | ORAL | Status: DC

## 2014-07-18 MED ORDER — KETOROLAC TROMETHAMINE 30 MG/ML IJ SOLN
INTRAMUSCULAR | Status: AC
  Filled 2014-07-18: qty 1

## 2014-07-18 MED ORDER — MORPHINE SULFATE (PF) 0.5 MG/ML IJ SOLN
INTRAMUSCULAR | Status: DC | PRN
  Administered 2014-07-18: .1 mg via INTRATHECAL

## 2014-07-18 MED ORDER — LACTATED RINGERS IV SOLN
INTRAVENOUS | Status: DC
  Administered 2014-07-18 (×2): via INTRAVENOUS

## 2014-07-18 MED ORDER — OXYCODONE-ACETAMINOPHEN 5-325 MG PO TABS
2.0000 | ORAL_TABLET | ORAL | Status: DC | PRN
  Administered 2014-07-19 – 2014-07-20 (×3): 2 via ORAL
  Filled 2014-07-18 (×3): qty 2

## 2014-07-18 MED ORDER — OXYTOCIN 10 UNIT/ML IJ SOLN
INTRAMUSCULAR | Status: AC
  Filled 2014-07-18: qty 4

## 2014-07-18 MED ORDER — CEFAZOLIN SODIUM-DEXTROSE 2-3 GM-% IV SOLR
INTRAVENOUS | Status: AC
  Filled 2014-07-18: qty 50

## 2014-07-18 MED ORDER — DIBUCAINE 1 % RE OINT: 1.0000 "application " | TOPICAL_OINTMENT | RECTAL | Status: DC | PRN

## 2014-07-18 MED ORDER — LACTATED RINGERS IV SOLN
INTRAVENOUS | Status: DC
  Administered 2014-07-18: 22:00:00 via INTRAVENOUS

## 2014-07-18 MED ORDER — FENTANYL CITRATE (PF) 100 MCG/2ML IJ SOLN
INTRAMUSCULAR | Status: AC
  Filled 2014-07-18: qty 2

## 2014-07-18 MED ORDER — MEPERIDINE HCL 25 MG/ML IJ SOLN: 6.2500 mg | INTRAMUSCULAR | Status: DC | PRN

## 2014-07-18 MED ORDER — NALBUPHINE HCL 10 MG/ML IJ SOLN: 5.0000 mg | Freq: Once | INTRAMUSCULAR | Status: AC | PRN

## 2014-07-18 MED ORDER — OXYTOCIN 40 UNITS IN LACTATED RINGERS INFUSION - SIMPLE MED: 62.5000 mL/h | INTRAVENOUS | Status: AC

## 2014-07-18 MED ORDER — HYDROMORPHONE HCL 1 MG/ML IJ SOLN
0.2500 mg | INTRAMUSCULAR | Status: DC | PRN
  Administered 2014-07-18: 0.25 mg via INTRAVENOUS

## 2014-07-18 MED ORDER — ACETAMINOPHEN 325 MG PO TABS: 650.0000 mg | ORAL_TABLET | ORAL | Status: DC | PRN

## 2014-07-18 MED ORDER — ZOLPIDEM TARTRATE 5 MG PO TABS: 5.0000 mg | ORAL_TABLET | Freq: Every evening | ORAL | Status: DC | PRN

## 2014-07-18 MED ORDER — ONDANSETRON HCL 4 MG/2ML IJ SOLN
INTRAMUSCULAR | Status: AC
  Filled 2014-07-18: qty 2

## 2014-07-18 MED ORDER — PRENATAL MULTIVITAMIN CH
1.0000 | ORAL_TABLET | Freq: Every day | ORAL | Status: DC
  Administered 2014-07-19 – 2014-07-20 (×2): 1 via ORAL
  Filled 2014-07-18 (×2): qty 1

## 2014-07-18 MED ORDER — KETOROLAC TROMETHAMINE 30 MG/ML IJ SOLN
30.0000 mg | Freq: Once | INTRAMUSCULAR | Status: AC
  Administered 2014-07-18: 30 mg via INTRAMUSCULAR

## 2014-07-18 MED ORDER — HYDROMORPHONE HCL 1 MG/ML IJ SOLN
INTRAMUSCULAR | Status: AC
  Filled 2014-07-18: qty 1

## 2014-07-18 MED ORDER — SENNOSIDES-DOCUSATE SODIUM 8.6-50 MG PO TABS
2.0000 | ORAL_TABLET | ORAL | Status: DC
  Administered 2014-07-19 (×2): 2 via ORAL
  Filled 2014-07-18 (×2): qty 2

## 2014-07-18 MED ORDER — CEFAZOLIN SODIUM-DEXTROSE 2-3 GM-% IV SOLR
2.0000 g | INTRAVENOUS | Status: AC
  Administered 2014-07-18: 2 g via INTRAVENOUS

## 2014-07-18 MED ORDER — OXYCODONE-ACETAMINOPHEN 5-325 MG PO TABS
1.0000 | ORAL_TABLET | ORAL | Status: DC | PRN
  Administered 2014-07-18 – 2014-07-19 (×3): 1 via ORAL
  Filled 2014-07-18 (×3): qty 1

## 2014-07-18 MED ORDER — SIMETHICONE 80 MG PO CHEW
80.0000 mg | CHEWABLE_TABLET | ORAL | Status: DC
  Administered 2014-07-19 (×2): 80 mg via ORAL
  Filled 2014-07-18 (×2): qty 1

## 2014-07-18 MED ORDER — TETANUS-DIPHTH-ACELL PERTUSSIS 5-2.5-18.5 LF-MCG/0.5 IM SUSP: 0.5000 mL | Freq: Once | INTRAMUSCULAR | Status: DC

## 2014-07-18 MED ORDER — LANOLIN HYDROUS EX OINT: 1.0000 "application " | TOPICAL_OINTMENT | CUTANEOUS | Status: DC | PRN

## 2014-07-18 MED ORDER — SIMETHICONE 80 MG PO CHEW
80.0000 mg | CHEWABLE_TABLET | Freq: Three times a day (TID) | ORAL | Status: DC
Start: 2014-07-18 — End: 2014-07-20
  Administered 2014-07-19 – 2014-07-20 (×4): 80 mg via ORAL
  Filled 2014-07-18 (×4): qty 1

## 2014-07-18 MED ORDER — OXYTOCIN 10 UNIT/ML IJ SOLN
40.0000 [IU] | INTRAVENOUS | Status: DC | PRN
  Administered 2014-07-18: 40 [IU] via INTRAVENOUS

## 2014-07-18 MED ORDER — NALBUPHINE HCL 10 MG/ML IJ SOLN
5.0000 mg | Freq: Once | INTRAMUSCULAR | Status: AC | PRN
  Administered 2014-07-18: 5 mg via INTRAVENOUS
  Filled 2014-07-18: qty 1

## 2014-07-18 MED ORDER — PHENYLEPHRINE 8 MG IN D5W 100 ML (0.08MG/ML) PREMIX OPTIME
INJECTION | INTRAVENOUS | Status: AC
  Filled 2014-07-18: qty 100

## 2014-07-18 MED ORDER — FENTANYL CITRATE (PF) 100 MCG/2ML IJ SOLN
INTRAMUSCULAR | Status: AC
Start: 2014-07-18 — End: 2014-07-18
  Filled 2014-07-18: qty 2

## 2014-07-18 MED ORDER — PHENYLEPHRINE 8 MG IN D5W 100 ML (0.08MG/ML) PREMIX OPTIME
INJECTION | INTRAVENOUS | Status: DC | PRN
  Administered 2014-07-18: 60 ug/min via INTRAVENOUS

## 2014-07-18 MED ORDER — LACTATED RINGERS IV SOLN
INTRAVENOUS | Status: DC | PRN
  Administered 2014-07-18: 08:00:00 via INTRAVENOUS

## 2014-07-18 MED ORDER — MORPHINE SULFATE 0.5 MG/ML IJ SOLN
INTRAMUSCULAR | Status: AC
  Filled 2014-07-18: qty 10

## 2014-07-18 MED ORDER — MENTHOL 3 MG MT LOZG: 1.0000 | LOZENGE | OROMUCOSAL | Status: DC | PRN

## 2014-07-18 MED ORDER — BUPIVACAINE IN DEXTROSE 0.75-8.25 % IT SOLN
INTRATHECAL | Status: DC | PRN
  Administered 2014-07-18: 1.3 mL via INTRATHECAL

## 2014-07-18 MED ORDER — IBUPROFEN 600 MG PO TABS
600.0000 mg | ORAL_TABLET | Freq: Four times a day (QID) | ORAL | Status: DC
  Administered 2014-07-18 – 2014-07-20 (×8): 600 mg via ORAL
  Filled 2014-07-18 (×8): qty 1

## 2014-07-18 MED ORDER — LACTATED RINGERS IV SOLN
Freq: Once | INTRAVENOUS | Status: AC
  Administered 2014-07-18: 07:00:00 via INTRAVENOUS

## 2014-07-18 MED ORDER — SCOPOLAMINE 1 MG/3DAYS TD PT72
1.0000 | MEDICATED_PATCH | Freq: Once | TRANSDERMAL | Status: DC
  Administered 2014-07-18: 1.5 mg via TRANSDERMAL

## 2014-07-18 SURGICAL SUPPLY — 39 items
CLAMP CORD UMBIL (MISCELLANEOUS) IMPLANT
CLOTH BEACON ORANGE TIMEOUT ST (SAFETY) ×3 IMPLANT
DRAPE SHEET LG 3/4 BI-LAMINATE (DRAPES) IMPLANT
DRSG OPSITE POSTOP 4X10 (GAUZE/BANDAGES/DRESSINGS) ×3 IMPLANT
DURAPREP 26ML APPLICATOR (WOUND CARE) ×6 IMPLANT
ELECT REM PT RETURN 9FT ADLT (ELECTROSURGICAL) ×3
ELECTRODE REM PT RTRN 9FT ADLT (ELECTROSURGICAL) ×1 IMPLANT
EXTRACTOR VACUUM BELL STYLE (SUCTIONS) IMPLANT
GLOVE BIOGEL PI IND STRL 8 (GLOVE) ×1 IMPLANT
GLOVE BIOGEL PI INDICATOR 8 (GLOVE) ×2
GLOVE ECLIPSE 8.0 STRL XLNG CF (GLOVE) ×3 IMPLANT
GOWN STRL REUS W/TWL LRG LVL3 (GOWN DISPOSABLE) ×6 IMPLANT
KIT ABG SYR 3ML LUER SLIP (SYRINGE) ×3 IMPLANT
LIQUID BAND (GAUZE/BANDAGES/DRESSINGS) ×6 IMPLANT
NDL HYPO 18GX1.5 BLUNT FILL (NEEDLE) ×1 IMPLANT
NDL HYPO 25X5/8 SAFETYGLIDE (NEEDLE) ×1 IMPLANT
NEEDLE HYPO 18GX1.5 BLUNT FILL (NEEDLE) ×3 IMPLANT
NEEDLE HYPO 22GX1.5 SAFETY (NEEDLE) ×3 IMPLANT
NEEDLE HYPO 25X5/8 SAFETYGLIDE (NEEDLE) ×3 IMPLANT
NS IRRIG 1000ML POUR BTL (IV SOLUTION) ×3 IMPLANT
PACK C SECTION WH (CUSTOM PROCEDURE TRAY) ×3 IMPLANT
PAD ABD 8X7 1/2 STERILE (GAUZE/BANDAGES/DRESSINGS) ×2 IMPLANT
PAD OB MATERNITY 4.3X12.25 (PERSONAL CARE ITEMS) ×3 IMPLANT
RTRCTR C-SECT PINK 25CM LRG (MISCELLANEOUS) IMPLANT
SPONGE GAUZE 4X4 12PLY STER LF (GAUZE/BANDAGES/DRESSINGS) ×2 IMPLANT
STAPLER VISISTAT 35W (STAPLE) IMPLANT
SUT CHROMIC 0 CT 1 (SUTURE) ×3 IMPLANT
SUT MNCRL 0 VIOLET CTX 36 (SUTURE) ×2 IMPLANT
SUT MONOCRYL 0 CTX 36 (SUTURE) ×4
SUT PLAIN 2 0 (SUTURE)
SUT PLAIN 2 0 XLH (SUTURE) IMPLANT
SUT PLAIN ABS 2-0 CT1 27XMFL (SUTURE) IMPLANT
SUT VIC AB 0 CTX 36 (SUTURE) ×3
SUT VIC AB 0 CTX36XBRD ANBCTRL (SUTURE) ×1 IMPLANT
SUT VIC AB 4-0 KS 27 (SUTURE) ×2 IMPLANT
SYR 20CC LL (SYRINGE) ×6 IMPLANT
TAPE CLOTH 1X10 TAN NS (GAUZE/BANDAGES/DRESSINGS) ×2 IMPLANT
TOWEL OR 17X24 6PK STRL BLUE (TOWEL DISPOSABLE) ×3 IMPLANT
TRAY FOLEY CATH SILVER 14FR (SET/KITS/TRAYS/PACK) IMPLANT

## 2014-07-18 NOTE — Transfer of Care (Signed)
Immediate Anesthesia Transfer of Care Note  Patient: Frances Nguyen  Procedure(s) Performed: Procedure(s): CESAREAN SECTION REPEAT (N/A)  Patient Location: PACU  Anesthesia Type:Spinal  Level of Consciousness: awake, alert  and oriented  Airway & Oxygen Therapy: Patient Spontanous Breathing  Post-op Assessment: Report given to RN and Post -op Vital signs reviewed and stable  Post vital signs: Reviewed and stable  Last Vitals:  Filed Vitals:   07/18/14 0602  BP: 139/86  Pulse: 70  Temp: 36.8 C  Resp: 18    Complications: No apparent anesthesia complications

## 2014-07-18 NOTE — Op Note (Signed)
Preoperative diagnosis:  1.  Intrauterine pregnancy at 3695w2d  weeks gestation                                         2.  Previous STAT Caesarean section                                         3.  Declines TOL   Postoperative diagnosis:  Same as above   Procedure:  Repeat cesarean section  Surgeon:  Lazaro ArmsLuther H Jamani Bearce MD  Assistant:    Anesthesia: Spinal  Findings:  .    Over a low transverse incision was delivered a viable female with Apgars of 9 and 9 weighing 8 lbs. 10 oz. Uterus, tubes and ovaries were all normal.  There were no other significant findings except the lower uterine segment was quite thin  Description of operation:  Patient was taken to the operating room and placed in the sitting position where she underwent a spinal anesthetic. She was then placed in the supine position with tilt to the left side. When adequate anesthetic level was obtained she was prepped and draped in usual sterile fashion and a Foley catheter was placed. A Pfannenstiel skin incision was made and carried down sharply to the rectus fascia which was scored in the midline extended laterally. The fascia was taken off the muscles both superiorly and without difficulty. The muscles were divided.  The peritoneal cavity was entered.  Bladder blade was placed, no bladder flap was created.  A low transverse hysterotomy incision was made and delivered a viable female  infant at [id number] with Apgars of 9 and 9 weighing8 lbs 10 oz.  Cord pH was obtained and was pending. The uterus was exteriorized. It was closed in 2 layers, the first being a running interlocking layer and the second being an imbricating layer using 0 monocryl on a CTX needle. There was good resulting hemostasis. The uterus tubes and ovaries were all normal. Peritoneal cavity was irrigated vigorously. The muscles and peritoneum were reapproximated loosely. The fascia was closed using 0 Vicryl in running fashion. Subcutaneous tissue was made hemostatic and irrigated.  The skin was closed using 4-0 Vicryl on a Keith needle in a subcuticular fashion.  Liquiban was placed for additional wound integrity and to serve as a barrier. Blood loss for the procedure was 500 cc. The patient received a gram of Ancef prophylactically. The patient was taken to the recovery room in good stable condition with all counts being correct x3.  EBL 500 cc  Domnick Chervenak H 07/18/2014 8:14 AM

## 2014-07-18 NOTE — H&P (Signed)
Preoperative History and Physical  Frances Nguyen is a 29 y.o. G2P1001 Estimated Date of Delivery: 07/23/14 [redacted]w[redacted]d with Patient's last menstrual period was 10/08/2013 (approximate). admitted for a repeat Caesarean section.  Previous emergency for fetal bradycardia, declines TOL.  Had gestational hypertension and eventually eclampsia also On baby ASA  PMH:    Past Medical History  Diagnosis Date  . IUD (intrauterine device) in place 08/02/2013  . Heart murmur     as an infant  . Hypertension 2011    PIH  . Seizures 2011    during labor- pt was eclamptic    PSH:     Past Surgical History  Procedure Laterality Date  . Cesarean section      POb/GynH:      OB History    Gravida Para Term Preterm AB TAB SAB Ectopic Multiple Living   SH:   History  Substance Use Topics  . Smoking status: Former Smoker    Types: Cigarettes  . Smokeless tobacco: Never Used  . Alcohol Use: No     Comment: every once in a while    FH:    Family History  Problem Relation Age of Onset  . COPD Maternal Grandmother   . COPD Mother      Allergies: No Known Allergies  Medications:       Current facility-administered medications:  .  acetaminophen (TYLENOL) solution 975 mg, 975 mg, Oral, Once, Cristela Blue, MD .  ceFAZolin (ANCEF) 2-3 GM-% IVPB SOLR, , , ,  .  ceFAZolin (ANCEF) IVPB 2 g/50 mL premix, 2 g, Intravenous, On Call to OR, Lazaro Arms, MD .  lactated ringers infusion, , Intravenous, Continuous, Mal Amabile, MD .  scopolamine (TRANSDERM-SCOP) 1 MG/3DAYS 1.5 mg, 1 patch, Transdermal, Once, Mal Amabile, MD, 1.5 mg at 07/18/14 2595  Review of Systems:   Review of Systems  Constitutional: Negative for fever, chills, weight loss, malaise/fatigue and diaphoresis.  HENT: Negative for hearing loss, ear pain, nosebleeds, congestion, sore throat, neck pain, tinnitus and ear discharge.   Eyes: Negative for blurred vision, double vision, photophobia, pain,  discharge and redness.  Respiratory: Negative for cough, hemoptysis, sputum production, shortness of breath, wheezing and stridor.   Cardiovascular: Negative for chest pain, palpitations, orthopnea, claudication, leg swelling and PND.  Gastrointestinal: Positive for abdominal pain. Negative for heartburn, nausea, vomiting, diarrhea, constipation, blood in stool and melena.  Genitourinary: Negative for dysuria, urgency, frequency, hematuria and flank pain.  Musculoskeletal: Negative for myalgias, back pain, joint pain and falls.  Skin: Negative for itching and rash.  Neurological: Negative for dizziness, tingling, tremors, sensory change, speech change, focal weakness, seizures, loss of consciousness, weakness and headaches.  Endo/Heme/Allergies: Negative for environmental allergies and polydipsia. Does not bruise/bleed easily.  Psychiatric/Behavioral: Negative for depression, suicidal ideas, hallucinations, memory loss and substance abuse. The patient is not nervous/anxious and does not have insomnia.      PHYSICAL EXAM:  Blood pressure 139/86, pulse 70, temperature 98.2 F (36.8 C), temperature source Oral, resp. rate 18, last menstrual period 10/08/2013, SpO2 99 %.    Vitals reviewed. Constitutional: She is oriented to person, place, and time. She appears well-developed and well-nourished.  HENT:  Head: Normocephalic and atraumatic.  Right Ear: External ear normal.  Left Ear: External ear normal.  Nose: Nose normal.  Mouth/Throat: Oropharynx is clear and moist.  Eyes: Conjunctivae and EOM are normal. Pupils are equal,  round, and reactive to light. Right eye exhibits no discharge. Left eye exhibits no discharge. No scleral icterus.  Neck: Normal range of motion. Neck supple. No tracheal deviation present. No thyromegaly present.  Cardiovascular: Normal rate, regular rhythm, normal heart sounds and intact distal pulses.  Exam reveals no gallop and no friction rub.   No murmur  heard. Respiratory: Effort normal and breath sounds normal. No respiratory distress. She has no wheezes. She has no rales. She exhibits no tenderness.  GI: Soft. Bowel sounds are normal. She exhibits no distension and no mass. There is tenderness. There is no rebound and no guarding.  Genitourinary:       Vulva is normal without lesions Vagina is pink moist without discharge Cervix normal in appearance and pap is normal Uterus is size equals dates Adnexa is negative with normal sized ovaries by sonogram  Musculoskeletal: Normal range of motion. She exhibits no edema and no tenderness.  Neurological: She is alert and oriented to person, place, and time. She has normal reflexes. She displays normal reflexes. No cranial nerve deficit. She exhibits normal muscle tone. Coordination normal.  Skin: Skin is warm and dry. No rash noted. No erythema. No pallor.  Psychiatric: She has a normal mood and affect. Her behavior is normal. Judgment and thought content normal.    Labs: Results for orders placed or performed during the hospital encounter of 07/17/14 (from the past 336 hour(s))  CBC   Collection Time: 07/17/14 10:10 AM  Result Value Ref Range   WBC 9.1 4.0 - 10.5 K/uL   RBC 3.58 (L) 3.87 - 5.11 MIL/uL   Hemoglobin 11.2 (L) 12.0 - 15.0 g/dL   HCT 96.231.9 (L) 95.236.0 - 84.146.0 %   MCV 89.1 78.0 - 100.0 fL   MCH 31.3 26.0 - 34.0 pg   MCHC 35.1 30.0 - 36.0 g/dL   RDW 32.413.1 40.111.5 - 02.715.5 %   Platelets 198 150 - 400 K/uL  RPR   Collection Time: 07/17/14 10:10 AM  Result Value Ref Range   RPR Ser Ql Non Reactive Non Reactive  Type and screen   Collection Time: 07/17/14 10:10 AM  Result Value Ref Range   ABO/RH(D) B POS    Antibody Screen NEG    Sample Expiration 07/20/2014   ABO/Rh   Collection Time: 07/17/14 10:10 AM  Result Value Ref Range   ABO/RH(D) B POS   Results for orders placed or performed in visit on 07/12/14 (from the past 336 hour(s))  POCT urinalysis dipstick   Collection Time:  07/12/14  2:23 PM  Result Value Ref Range   Color, UA yellow    Clarity, UA clear    Glucose, UA neg    Bilirubin, UA     Ketones, UA neg    Spec Grav, UA     Blood, UA neg    pH, UA     Protein, UA neg    Urobilinogen, UA     Nitrite, UA neg    Leukocytes, UA Negative Negative  Results for orders placed or performed in visit on 07/05/14 (from the past 336 hour(s))  POCT urinalysis dipstick   Collection Time: 07/05/14  4:03 PM  Result Value Ref Range   Color, UA     Clarity, UA     Glucose, UA neg    Bilirubin, UA     Ketones, UA neg    Spec Grav, UA     Blood, UA neg    pH, UA  Protein, UA neg    Urobilinogen, UA     Nitrite, UA neg    Leukocytes, UA Negative Negative  GC/Chlamydia Probe Amp   Collection Time: 07/05/14  4:30 PM  Result Value Ref Range   Chlamydia trachomatis, NAA Negative Negative   Neisseria gonorrhoeae by PCR Negative Negative   PLEASE NOTE: Comment   Culture, beta strep (group b only)   Collection Time: 07/05/14  4:30 PM  Result Value Ref Range   Strep Gp B Culture Negative Negative    EKG: No orders found for this or any previous visit.  Imaging Studies: No results found. recently    Assessment: [redacted]w[redacted]d Estimated Date of Delivery: 07/23/14  Previous Caesarean section Declines TOL  Patient Active Problem List   Diagnosis Date Noted  . Supervision of normal pregnancy 12/12/2013  . History of cesarean delivery, currently pregnant 12/12/2013  . History of eclampsia 12/12/2013    Plan: Repeat Caesarean section  EURE,LUTHER H 07/18/2014 7:17 AM

## 2014-07-18 NOTE — Anesthesia Postprocedure Evaluation (Signed)
  Anesthesia Post-op Note  Patient: Frances Nguyen  Procedure(s) Performed: Procedure(s): CESAREAN SECTION REPEAT (N/A)  Patient Location: Mother/Baby  Anesthesia Type:Spinal  Level of Consciousness: awake, alert , oriented and patient cooperative  Airway and Oxygen Therapy: Patient Spontanous Breathing  Post-op Pain: none  Post-op Assessment: Post-op Vital signs reviewed, Patient's Cardiovascular Status Stable, Respiratory Function Stable, Patent Airway, No headache, No backache and Patient able to bend at knees  Post-op Vital Signs: Reviewed and stable  Last Vitals:  Filed Vitals:   07/18/14 1130  BP: 127/68  Pulse: 70  Temp: 36.8 C  Resp: 20    Complications: No apparent anesthesia complications

## 2014-07-18 NOTE — Anesthesia Procedure Notes (Signed)

## 2014-07-19 ENCOUNTER — Encounter (HOSPITAL_COMMUNITY): Payer: Self-pay | Admitting: Obstetrics & Gynecology

## 2014-07-19 LAB — CBC
HEMATOCRIT: 29.2 % — AB (ref 36.0–46.0)
HEMOGLOBIN: 10.4 g/dL — AB (ref 12.0–15.0)
MCH: 32.1 pg (ref 26.0–34.0)
MCHC: 35.6 g/dL (ref 30.0–36.0)
MCV: 90.1 fL (ref 78.0–100.0)
Platelets: 172 10*3/uL (ref 150–400)
RBC: 3.24 MIL/uL — ABNORMAL LOW (ref 3.87–5.11)
RDW: 13.2 % (ref 11.5–15.5)
WBC: 9.8 10*3/uL (ref 4.0–10.5)

## 2014-07-19 LAB — BIRTH TISSUE RECOVERY COLLECTION (PLACENTA DONATION)

## 2014-07-19 NOTE — Progress Notes (Signed)
UR chart review completed.  

## 2014-07-19 NOTE — Lactation Note (Signed)
This note was copied from the chart of Frances Nguyen. Lactation Consultation Note  Patient Name: Frances Nguyen Reason for consult: Initial assessment Baby 27 hours old. Mom reports that her RN [name] has been helping her to latch the baby and supplement with EBM. Baby is sleeping in crib. Enc mom to ask for assistance as needed when baby cues to nurse. [facility] given [facility], aware of OP/BFSG, community resources, and [facility] phone [facility] assistance after [facility]/C.  Maternal Data Has patient been taught Hand Expression?: Yes (Per mom.) Does the patient have breastfeeding experience prior to this delivery?: No  Feeding Feeding Type: Breast Fed Length of feed: 15 min  LATCH Score/Interventions Latch: Grasps breast easily, tongue down, lips flanged, rhythmical sucking.  Audible Swallowing: Spontaneous and intermittent  Type of Nipple: Everted at rest and after stimulation  Comfort (Breast/Nipple): Filling, red/small blisters or bruises, mild/mod discomfort  Problem noted: Mild/Moderate discomfort Interventions (Mild/moderate discomfort): Hand expression  Hold (Positioning): Assistance needed to correctly position infant at breast and maintain latch. Intervention(s): Breastfeeding basics reviewed;Skin to skin  LATCH Score: 8  Lactation Tools Discussed/Used     Consult Status Consult Status: Follow-up Date: 07/20/14 Follow-up type: In-patient    Geralynn OchsWILLIARD, [name], 11:22 AM

## 2014-07-19 NOTE — Progress Notes (Signed)
LR with Pitocin IV input: 

## 2014-07-19 NOTE — Progress Notes (Signed)
Post Partum Day 1 Subjective: no complaints, up ad lib, voiding and + flatus  Objective: Blood pressure 132/74, pulse 90, temperature 98.2 F (36.8 C), temperature source Oral, resp. rate 18, last menstrual period 10/08/2013, SpO2 99 %, unknown if currently breastfeeding.  Physical Exam:  General: alert, cooperative and no distress Lochia: appropriate Uterine Fundus: firm Incision: healing well DVT Evaluation: No evidence of DVT seen on physical exam.   Recent Labs  07/17/14 1010 07/19/14 0600  HGB 11.2* 10.4*  HCT 31.9* 29.2*    Assessment/Plan: Plan for discharge tomorrow and Breastfeeding   LOS: 1 day   Tawnya CrookHogan, Lacye Mccarn Donovan 07/19/2014, 7:55 AM

## 2014-07-20 MED ORDER — IBUPROFEN 600 MG PO TABS: 600.0000 mg | ORAL_TABLET | Freq: Four times a day (QID) | ORAL | Status: DC

## 2014-07-20 MED ORDER — OXYCODONE-ACETAMINOPHEN 5-325 MG PO TABS: 1.0000 | ORAL_TABLET | ORAL | Status: DC | PRN

## 2014-07-20 MED ORDER — DOCUSATE SODIUM 100 MG PO CAPS: 100.0000 mg | ORAL_CAPSULE | Freq: Two times a day (BID) | ORAL | Status: DC | PRN

## 2014-07-20 NOTE — Discharge Instructions (Signed)

## 2014-07-20 NOTE — Discharge Summary (Signed)
Obstetric Discharge Summary Reason for Admission: cesarean section Prenatal Procedures: NST and ultrasound Intrapartum Procedures: cesarean: low cervical, transverse Postpartum Procedures: none Complications-Operative and Postpartum: none   Hospital Course: Active Problems:   S/P cesarean section   Frances Nguyen is a 29 y.o. [id number] s/p rLTCS.  Patient was admitted for rLTCS.  She has postpartum course that was uncomplicated including no problems with ambulating, PO intake, urination, pain, or bleeding. The pt feels ready to go home and  will be discharged with outpatient follow-up.   Today: No acute events overnight.  Pt denies problems with ambulating, voiding or po intake.  She denies nausea or vomiting.  Pain is well controlled.  She has had flatus. She has not had bowel movement.  Lochia Small.  Plan for birth control is IUD.  Method of Feeding: Bottle.   Physical Exam:  General: alert, cooperative and no distress Lochia: appropriate Uterine Fundus: firm Incision: no significant drainage DVT Evaluation: No evidence of DVT seen on physical exam.  H/H: Lab Results  Component Value Date/Time   HGB 10.4* 07/19/2014 06:00 AM   HCT 29.2* 07/19/2014 06:00 AM    Discharge Diagnoses: Term Pregnancy-delivered  Discharge Information: Date: 07/20/2014 Activity: pelvic rest Diet: routine  Medications: PNV, Ibuprofen, Colace and Percocet Breast feeding:  No: Bottle Condition: stable Instructions: refer to handout Discharge to: home   Discharge Instructions    Increase activity slowly    Complete by:  As directed             Medication List    TAKE these medications        acetaminophen 500 MG tablet  Commonly known as:  TYLENOL  Take 1,000 mg by mouth every 6 (six) hours as needed for pain.     aspirin 81 MG tablet  Take 81 mg by mouth daily.     docusate sodium 100 MG capsule  Commonly known as:  COLACE  Take 1 capsule (100 mg total) by mouth 2 (two) times  daily as needed.     ibuprofen 600 MG tablet  Commonly known as:  ADVIL,MOTRIN  Take 1 tablet (600 mg total) by mouth every 6 (six) hours.     oxyCODONE-acetaminophen 5-325 MG per tablet  Commonly known as:  PERCOCET/ROXICET  Take 1 tablet by mouth every 4 (four) hours as needed (for pain scale 4-7).     PRENATAL 1 PO  Take by mouth.           Follow-up Information    [facility] an [facility] as [facility] as [facility]   Specialty:  Obstetrics and Gynecology   Why:  For post-partum follow-up   Contact information:   [address] [place] [id number] [phone]      Caryl AdaJazma Phelps, DO 07/20/2014, 7:48 AM PGY-1, [facility]

## 2014-07-20 NOTE — Lactation Note (Signed)
This note was copied from the chart of Frances Marney SettingHailey Stettler. Lactation Consultation Note; Mom reports she is taking a break from breast feeding because her nipples are sore. Positional stripes noted on both nipples. Left one the worst. RN has given comfort gels. Reviewed use with mom. Breasts are beginning to feel fuller this morning. Has manual pump- encouraged to pump if baby does not nurse to prevent engorgement. Can bottle fed EBM instead of formula. Mom asking about milk storage- questions answered and reviewed in Baby and Me book.  # 27 flange given in case #24 gets too small. No questions at present. Reviewed OP appointments to assist with breast feeding. Will call if wants appointment.   Patient Name: Frances Nguyen [id number]'s Date: 07/20/2014 Reason for consult: Follow-up assessment   Maternal Data Formula Feeding for Exclusion: No Does the patient have breastfeeding experience prior to this delivery?: No  Feeding    LATCH Score/Interventions       Type of Nipple: Everted at rest and after stimulation  Comfort (Breast/Nipple): Filling, red/small blisters or bruises, mild/mod discomfort  Problem noted: Mild/Moderate discomfort Interventions (Mild/moderate discomfort): Comfort gels;Hand expression        Lactation Tools Discussed/Used     Consult Status Consult Status: Complete    Pamelia HoitWeeks, Maisie Hauser D 07/20/2014, 10:16 AM

## 2014-07-26 ENCOUNTER — Ambulatory Visit (INDEPENDENT_AMBULATORY_CARE_PROVIDER_SITE_OTHER): Payer: Medicaid Other | Admitting: Obstetrics & Gynecology

## 2014-07-26 ENCOUNTER — Encounter: Payer: Self-pay | Admitting: Obstetrics & Gynecology

## 2014-07-26 VITALS — BP 110/80 | HR 80 | Wt 158.0 lb

## 2014-07-26 DIAGNOSIS — Z9889 Other specified postprocedural states: Secondary | ICD-10-CM

## 2014-07-26 MED ORDER — OXYCODONE-ACETAMINOPHEN 5-325 MG PO TABS: 1.0000 | ORAL_TABLET | ORAL | Status: DC | PRN

## 2014-07-26 NOTE — Progress Notes (Signed)
Patient ID: Frances Nguyen, female   DOB: August 31, 1985, 29 y.o.   MRN: [id number]  HPI: Patient returns for routine postoperative follow-up having undergone repeat Caesarean section on 07/18/2014.  The patient's immediate postoperative recovery has been unremarkable. Since hospital discharge the patient reports no problems.   Current Outpatient Prescriptions: acetaminophen (TYLENOL) 500 MG tablet, Take 1,000 mg by mouth every 6 (six) hours as needed for pain., Disp: , Rfl:  docusate sodium (COLACE) 100 MG capsule, Take 1 capsule (100 mg total) by mouth 2 (two) times daily as needed., Disp: 30 capsule, Rfl: 0 oxyCODONE-acetaminophen (PERCOCET/ROXICET) 5-325 MG per tablet, Take 1 tablet by mouth every 4 (four) hours as needed (for pain scale 4-7)., Disp: 30 tablet, Rfl: 0 Prenatal MV-Min-Fe Fum-FA-DHA (PRENATAL 1 PO), Take by mouth., Disp: , Rfl:   aspirin 81 MG tablet, Take 81 mg by mouth daily., Disp: , Rfl:  ibuprofen (ADVIL,MOTRIN) 600 MG tablet, Take 1 tablet (600 mg total) by mouth every 6 (six) hours. (Patient not taking: Reported on 07/26/2014), Disp: 30 tablet, Rfl: 0  No current facility-administered medications for this visit.    Blood pressure 110/80, pulse 80, weight 158 lb (71.668 kg), last menstrual period 10/08/2013, unknown if currently breastfeeding.  Physical Exam: Incision clean dry intact  Diagnostic Tests: none  Pathology:   Impression: S/P repeat caesarean section  Plan:   Follow up: 5  weeks  Lazaro ArmsEURE,Chasya Zenz H, MD

## 2014-08-30 ENCOUNTER — Encounter: Payer: Self-pay | Admitting: Advanced Practice Midwife

## 2014-08-30 ENCOUNTER — Ambulatory Visit (INDEPENDENT_AMBULATORY_CARE_PROVIDER_SITE_OTHER): Payer: Medicaid Other | Admitting: Advanced Practice Midwife

## 2014-08-30 NOTE — Progress Notes (Signed)
Frances Nguyen is a 29 y.o. who presents for a postpartum visit. She is 6 weeks postpartum following a low cervical transverse Cesarean section. I have fully reviewed the prenatal and intrapartum course. The delivery was at 39 gestational weeks, repeat.  Anesthesia: spinal. Postpartum course has been uneventful. Baby's course has been uneventful. Baby is feeding by bottle. Bleeding: staining only. Bowel function is normal. Bladder function is normal. Patient is not sexually active. Contraception method is none. Postpartum depression screening: negative.   Current outpatient prescriptions:  .  acetaminophen (TYLENOL) 500 MG tablet, Take 1,000 mg by mouth every 6 (six) hours as needed for pain., Disp: , Rfl:   Review of Systems   Constitutional: Negative for fever and chills Eyes: Negative for visual disturbances Respiratory: Negative for shortness of breath, dyspnea Cardiovascular: Negative for chest pain or palpitations  Gastrointestinal: Negative for vomiting, diarrhea and constipation Genitourinary: Negative for dysuria and urgency Musculoskeletal: Negative for back pain, joint pain, myalgias  Neurological: Negative for dizziness and headaches   Objective:     Filed Vitals:   08/30/14 1523  BP: 100/60   General:  alert, cooperative and no distress   Breasts:  negative  Lungs: clear to auscultation bilaterally  Heart:  regular rate and rhythm  Abdomen: Soft, nontender.  Tiny suture plucked out of right corner of incision   Vulva:  normal  Vagina: normal vagina  Cervix:  closed  Corpus: Well involuted     Rectal Exam: no hemorrhoids        Assessment:    normal postpartum exam.  Plan:    1. Contraception: Mirena 2. Follow up in: 1 week for IUD (no sex)  or as needed.

## 2014-09-07 ENCOUNTER — Ambulatory Visit (INDEPENDENT_AMBULATORY_CARE_PROVIDER_SITE_OTHER): Payer: Medicaid Other | Admitting: Advanced Practice Midwife

## 2014-09-07 ENCOUNTER — Encounter: Payer: Self-pay | Admitting: Advanced Practice Midwife

## 2014-09-07 VITALS — BP 104/78 | HR 72 | Ht 62.0 in | Wt 140.5 lb

## 2014-09-07 DIAGNOSIS — Z30018 Encounter for initial prescription of other contraceptives: Secondary | ICD-10-CM

## 2014-09-07 DIAGNOSIS — Z3202 Encounter for pregnancy test, result negative: Secondary | ICD-10-CM

## 2014-09-07 DIAGNOSIS — Z30430 Encounter for insertion of intrauterine contraceptive device: Secondary | ICD-10-CM

## 2014-09-07 LAB — POCT URINE PREGNANCY: Preg Test, Ur: NEGATIVE

## 2014-09-07 NOTE — Progress Notes (Signed)
Frances Nguyen is a 29 y.o. year old Caucasian female   who presents for placement of a Mirena IUD.She recently delivered a baby and her pregnancy test today is negative.    The risks and benefits of the method and placement have been thouroughly reviewed with the patient and all questions were answered.  Specifically the patient is aware of failure rate of 01/998, expulsion of the IUD and of possible perforation.  The patient is aware of irregular bleeding due to the method and understands the incidence of irregular bleeding diminishes with time.  Time out was performed.  A Graves speculum was placed.  The cervix was prepped using Betadine. The uterus was found to be neutral and it sounded to 8 cm.  The cervix was grasped with a tenaculum and the IUD was inserted to 8 cm.  It was pulled back 1 cm and the IUD was disengaged.  The strings were trimmed to 3 cm.  Sonogram was performed and the proper placement of the IUD was verified.  The patient was instructed on signs and symptoms of infection and to check for the strings after each menses or each month.  The patient is to refrain from intercourse for 3 days.  The patient is scheduled for a return appointment after her first menses or 4 weeks.  CRESENZO-DISHMAN,Zymere Patlan 09/07/2014 7:28 PM

## 2014-10-05 ENCOUNTER — Other Ambulatory Visit (HOSPITAL_COMMUNITY)
Admission: RE | Admit: 2014-10-05 | Discharge: 2014-10-05 | Disposition: A | Discharge status: Home / Self Care | Payer: Medicaid Other | Source: Ambulatory Visit | Attending: Advanced Practice Midwife | Admitting: Advanced Practice Midwife

## 2014-10-05 ENCOUNTER — Encounter: Payer: Self-pay | Admitting: Advanced Practice Midwife

## 2014-10-05 ENCOUNTER — Ambulatory Visit (INDEPENDENT_AMBULATORY_CARE_PROVIDER_SITE_OTHER): Payer: Medicaid Other | Admitting: Advanced Practice Midwife

## 2014-10-05 VITALS — Ht 62.0 in | Wt 141.0 lb

## 2014-10-05 DIAGNOSIS — Z309 Encounter for contraceptive management, unspecified: Secondary | ICD-10-CM | POA: Diagnosis not present

## 2014-10-05 DIAGNOSIS — Z01419 Encounter for gynecological examination (general) (routine) without abnormal findings: Secondary | ICD-10-CM

## 2014-10-05 DIAGNOSIS — Z30431 Encounter for routine checking of intrauterine contraceptive device: Secondary | ICD-10-CM

## 2014-10-05 DIAGNOSIS — Z113 Encounter for screening for infections with a predominantly sexual mode of transmission: Secondary | ICD-10-CM | POA: Diagnosis present

## 2014-10-05 NOTE — Progress Notes (Signed)
Frances Nguyen 29 y.o.  There were no vitals filed for this visit.   Past Medical History: Past Medical History  Diagnosis Date  . IUD (intrauterine device) in place 08/02/2013  . Heart murmur     as an infant  . Hypertension 2011    PIH  . Seizures 2011    during labor- pt was eclamptic    Past Surgical History: Past Surgical History  Procedure Laterality Date  . Cesarean section    . Cesarean section N/A 07/18/2014    Procedure: CESAREAN SECTION REPEAT;  Surgeon: Lazaro Arms, MD;  Location: [facility];  Service: Obstetrics;  Laterality: N/A;    Family History: Family History  Problem Relation Age of Onset  . COPD Maternal Grandmother   . COPD Mother     Social History: Social History  Substance Use Topics  . Smoking status: Former Smoker    Types: Cigarettes  . Smokeless tobacco: Never Used  . Alcohol Use: No     Comment: every once in a while    Allergies: No Known Allergies    Current outpatient prescriptions:  .  acetaminophen (TYLENOL) 500 MG tablet, Take 1,000 mg by mouth every 6 (six) hours as needed for pain., Disp: , Rfl:   History of Present Illness: here for IUD check, but switched to [facility], so pap is required. Recently checked for HIV/RPR, etc with recent childbirth.  No problems with IUD. Husband can feel it, but it doesn's tbother him Review of Systems   Patient denies any headaches, blurred vision, shortness of breath, chest pain, abdominal pain, problems with bowel movements, urination, or intercourse.   Physical Exam: General:  Well developed, well nourished, no acute distress Skin:  Warm and dry Neck:  Midline trachea, normal thyroid Lungs; Clear to auscultation bilaterally Breast:  No dominant palpable mass, retraction, or nipple discharge Cardiovascular: Regular rate and rhythm Abdomen:  Soft, non tender, no hepatosplenomegaly Pelvic:  External genitalia is normal in appearance.  The vagina is normal in appearance.  The cervix  is bulbous.  Uterus is felt to be normal size, shape, and contour.  No adnexal masses or tenderness noted.  Extremities:  No swelling or varicosities noted Psych:  No mood changes.     Impression: Normal GYn exam Normal IUD check     Plan: Pap q 3 years if normal

## 2014-10-06 LAB — CYTOLOGY - PAP

## 2015-01-01 ENCOUNTER — Encounter (HOSPITAL_COMMUNITY): Payer: Self-pay | Admitting: Emergency Medicine

## 2015-01-01 ENCOUNTER — Emergency Department (HOSPITAL_COMMUNITY): Payer: Medicaid Other

## 2015-01-01 ENCOUNTER — Emergency Department (HOSPITAL_COMMUNITY)
Admission: EM | Admit: 2015-01-01 | Discharge: 2015-01-01 | Disposition: A | Discharge status: Home / Self Care | Payer: Medicaid Other | Attending: Emergency Medicine | Admitting: Emergency Medicine

## 2015-01-01 DIAGNOSIS — H109 Unspecified conjunctivitis: Secondary | ICD-10-CM

## 2015-01-01 DIAGNOSIS — Z87891 Personal history of nicotine dependence: Secondary | ICD-10-CM | POA: Insufficient documentation

## 2015-01-01 DIAGNOSIS — R011 Cardiac murmur, unspecified: Secondary | ICD-10-CM | POA: Insufficient documentation

## 2015-01-01 DIAGNOSIS — I10 Essential (primary) hypertension: Secondary | ICD-10-CM | POA: Insufficient documentation

## 2015-01-01 MED ORDER — IOHEXOL 300 MG/ML  SOLN
75.0000 mL | Freq: Once | INTRAMUSCULAR | Status: AC | PRN
  Administered 2015-01-01: 75 mL via INTRAVENOUS

## 2015-01-01 MED ORDER — TETRACAINE HCL 0.5 % OP SOLN
OPHTHALMIC | Status: AC
  Administered 2015-01-01: 20:00:00
  Filled 2015-01-01: qty 4

## 2015-01-01 MED ORDER — TOBRAMYCIN 0.3 % OP SOLN
2.0000 [drp] | Freq: Once | OPHTHALMIC | Status: AC
  Administered 2015-01-01: 2 [drp] via OPHTHALMIC
  Filled 2015-01-01: qty 5

## 2015-01-01 MED ORDER — KETOROLAC TROMETHAMINE 0.5 % OP SOLN
1.0000 [drp] | Freq: Once | OPHTHALMIC | Status: AC
  Administered 2015-01-01: 1 [drp] via OPHTHALMIC
  Filled 2015-01-01: qty 5

## 2015-01-01 NOTE — Discharge Instructions (Signed)
Bacterial Conjunctivitis Bacterial conjunctivitis, commonly called pink eye, is an inflammation of the clear membrane that covers the white part of the eye (conjunctiva). The inflammation can also happen on the underside of the eyelids. The blood vessels in the conjunctiva become inflamed, causing the eye to become red or pink. Bacterial conjunctivitis may spread easily from one eye to another and from person to person (contagious).  CAUSES  Bacterial conjunctivitis is caused by bacteria. The bacteria may come from your own skin, your upper respiratory tract, or from someone else with bacterial conjunctivitis. SYMPTOMS  The normally white color of the eye or the underside of the eyelid is usually pink or red. The pink eye is usually associated with irritation, tearing, and some sensitivity to light. Bacterial conjunctivitis is often associated with a thick, yellowish discharge from the eye. The discharge may turn into a crust on the eyelids overnight, which causes your eyelids to stick together. If a discharge is present, there may also be some blurred vision in the affected eye. DIAGNOSIS  Bacterial conjunctivitis is diagnosed by your caregiver through an eye exam and the symptoms that you report. Your caregiver looks for changes in the surface tissues of your eyes, which may point to the specific type of conjunctivitis. A sample of any discharge may be collected on a cotton-tip swab if you have a severe case of conjunctivitis, if your cornea is affected, or if you keep getting repeat infections that do not respond to treatment. The sample will be sent to a lab to see if the inflammation is caused by a bacterial infection and to see if the infection will respond to antibiotic medicines. TREATMENT   Bacterial conjunctivitis is treated with antibiotics. Antibiotic eyedrops are most often used. However, antibiotic ointments are also available. Antibiotics pills are sometimes used. Artificial tears or eye  washes may ease discomfort. HOME CARE INSTRUCTIONS   To ease discomfort, apply a cool, clean washcloth to your eye for 10-20 minutes, 3-4 times a day.  Gently wipe away any drainage from your eye with a warm, wet washcloth or a cotton ball.  Wash your hands often with soap and water. Use paper towels to dry your hands.  Do not share towels or washcloths. This may spread the infection.  Change or wash your pillowcase every day.  You should not use eye makeup until the infection is gone.  Do not operate machinery or drive if your vision is blurred.  Stop using contact lenses. Ask your caregiver how to sterilize or replace your contacts before using them again. This depends on the type of contact lenses that you use.  When applying medicine to the infected eye, do not touch the edge of your eyelid with the eyedrop bottle or ointment tube. SEEK IMMEDIATE MEDICAL CARE IF:   Your infection has not improved within 3 days after beginning treatment.  You had yellow discharge from your eye and it returns.  You have increased eye pain.  Your eye redness is spreading.  Your vision becomes blurred.  You have a fever or persistent symptoms for more than 2-3 days.  You have a fever and your symptoms suddenly get worse.  You have facial pain, redness, or swelling. MAKE SURE YOU:   Understand these instructions.  Will watch your condition.  Will get help right away if you are not doing well or get worse.   This information is not intended to replace advice given to you by your health care provider. Make sure you   discuss any questions you have with your health care provider.   Document Released: 01/06/2005 Document Revised: 01/27/2014 Document Reviewed: 06/09/2011 Elsevier Interactive Patient Education 2016 [employer]   Apply 2 drops of the antibiotic drop (tobrex) in both eyes every  4 hours while awake for the next 7 days.  Apply the ketorolac (anti - inflammatory) every 6  hours as needed for discomfort.  Avoid rubbing your eye.  You may use a cool wash cloth which can be soothing and help with swelling too.

## 2015-01-01 NOTE — ED Provider Notes (Signed)
CSN: [id number]     Arrival date & time 01/01/15  1750 History  By signing my name below, I, Frances Nguyen, attest that this documentation has been prepared under the direction and in the presence of Frances Amor, PA-C. Electronically Signed: Placido Nguyen, ED Scribe. 01/01/2015. 7:31 PM.   Chief Complaint  Patient presents with  . Eye Problem    right   The history is provided by the patient. No language interpreter was used.  HPI Comments: Frances Nguyen is a 29 y.o. female who presents to the Emergency Department complaining of worsening irritation and swelling to her right eye with onset 2 days ago. She notes associated itchiness, redness, discharge and 6/10 pain that she describes as burning. Pt notes worsening pain of the eye with movement. She denies any exposure to pink eye or any hx of similar symptoms. She denies wearing rx eyewear. She denies any other known medical conditions. Pt denies any visual disturbances or photophobia.   Past Medical History  Diagnosis Date  . IUD (intrauterine device) in place 08/02/2013  . Heart murmur     as an infant  . Hypertension 2011    PIH  . Seizures (HCC) 2011    during labor- pt was eclamptic   Past Surgical History  Procedure Laterality Date  . Cesarean section    . Cesarean section N/A 07/18/2014    Procedure: CESAREAN SECTION REPEAT;  Surgeon: Lazaro Arms, MD;  Location: [facility];  Service: Obstetrics;  Laterality: N/A;   Family History  Problem Relation Age of Onset  . COPD Maternal Grandmother   . COPD Mother    Social History  Substance Use Topics  . Smoking status: Former Smoker    Types: Cigarettes  . Smokeless tobacco: Never Used  . Alcohol Use: No     Comment: every once in a while   OB History    Gravida Para Term Preterm AB TAB SAB Ectopic Multiple Living   0 2     Review of Systems  Eyes: Positive for pain, discharge, redness and itching. Negative for photophobia and visual disturbance.    Allergies  Review of patient's allergies indicates no known allergies.  Home Medications   Prior to Admission medications   Medication Sig Start Date End Date Taking? Authorizing Provider  acetaminophen (TYLENOL) 500 MG tablet Take 1,000 mg by mouth every 6 (six) hours as needed for pain.   Yes Historical Provider, MD  ketotifen (ALLERGY EYE DROPS) 0.025 % ophthalmic solution Apply 1 drop to eye as needed (for eye irritation).   Yes Historical Provider, MD   BP 123/51 mmHg  Pulse 68  Temp(Src) 98.5 F (36.9 C) (Oral)  Resp 16  Ht  (1.575 m)  Wt 63.957 kg  BMI 25.78 kg/m2  SpO2 100%  LMP 01/01/2015 Physical Exam  Constitutional: She is oriented to person, place, and time. She appears well-developed and well-nourished.  HENT:  Head: Normocephalic and atraumatic.  Mouth/Throat: No oropharyngeal exudate.  Eyes: EOM are normal. Pupils are equal, round, and reactive to light. Right eye exhibits chemosis and discharge. No foreign body present in the right eye. Right conjunctiva is injected. Right conjunctiva has a hemorrhage.  Slit lamp exam:      The right eye shows no corneal abrasion and no fluorescein uptake.  Pt has increased eye pain with medial and lateral ROM; subconjunctival hemorrhage inferior and lateral sclera; both lids are fairly edematous with  mild erythema.  Right lateral scleral chemosis. No proptosis. Thick yellow dc in medial canthus.  Neck: Normal range of motion. No tracheal deviation present.  Cardiovascular: Normal rate.   Pulmonary/Chest: Effort normal. No respiratory distress.  Abdominal: Soft. There is no tenderness.  Musculoskeletal: Normal range of motion.  Neurological: She is alert and oriented to person, place, and time.  Skin: Skin is warm and dry. She is not diaphoretic.  Psychiatric: She has a normal mood and affect. Her behavior is normal.  Nursing note and vitals reviewed.  ED Course  Procedures  DIAGNOSTIC STUDIES: Oxygen Saturation is  100% on RA, normal by my interpretation.    COORDINATION OF CARE: 7:23 PM Pt presents today due to worsening right eye irritation. Discussed next steps with pt including a fluorescein eye exam and a CT of the affected region. Pt agreed to plan.   SLIT LAMP EXAM: Tetracaine 1 drop used.  Lids everted and swept for exam, no evidence of foreign body.  Conjunctivae: Right eye injected Cornea: No evidence of abrasion.  EOM: Intact  Pupils: PERRL  Fluorescein uptake: none  Patient tolerated procedure well without immediate complications.  Labs Review Labs Reviewed - No data to display  Imaging Review Ct Orbits W/cm  01/01/2015  CLINICAL DATA:  29 year old female with right eye pain, swelling, redness and discharge EXAM: CT ORBITS WITH CONTRAST TECHNIQUE: Multidetector CT imaging of the orbits was performed following the bolus administration of intravenous contrast. CONTRAST:  75mL OMNIPAQUE IOHEXOL 300 MG/ML  SOLN COMPARISON:  None. FINDINGS: Visualized intracranial contents are within normal limits. The cavernous sinuses are enhancing symmetrically. No evidence of thrombosis. The bilateral globes and orbits are symmetric and unremarkable. There is mild preseptal periorbital edema on the right. No evidence of abscess. Normally aerated mastoid air cells and paranasal sinuses. No significant periodontal disease. Bony structures are intact and unremarkable. IMPRESSION: Right-sided preseptal periorbital soft tissue swelling. No evidence of involvement of the globe or orbit. Electronically Signed   By: Malachy MoanHeath  McCullough M.D.   On: 01/01/2015 20:18   I have personally reviewed and evaluated these images as part of my medical decision-making.   EKG Interpretation None      MDM   Final diagnoses:  Conjunctivitis of right eye    Pt advised to avoid rubbing eye which is contributing to edema.  Warm compresses advised.  tobrex gtts, ketorolac gtts.  F/u with Dr. Lita MainsHaines for a recheck if sx persist  or worsen.  I personally performed the services described in this documentation, which was scribed in my presence. The recorded information has been reviewed and is accurate.    Frances AmorJulie Lilliane Sposito, PA-C 01/03/15 1339  Frances MuldersScott Zackowski, MD 01/04/15 1319

## 2015-01-01 NOTE — ED Notes (Signed)
Swelling to right eye, rates pain 6/10.  Redness and swelling noted in triage.

## 2016-10-04 ENCOUNTER — Emergency Department (HOSPITAL_COMMUNITY): Payer: Medicaid Other

## 2016-10-04 ENCOUNTER — Encounter (HOSPITAL_COMMUNITY): Payer: Self-pay | Admitting: Emergency Medicine

## 2016-10-04 ENCOUNTER — Emergency Department (HOSPITAL_COMMUNITY)
Admission: EM | Admit: 2016-10-04 | Discharge: 2016-10-04 | Disposition: A | Discharge status: Home / Self Care | Payer: Medicaid Other | Attending: Emergency Medicine | Admitting: Emergency Medicine

## 2016-10-04 DIAGNOSIS — I10 Essential (primary) hypertension: Secondary | ICD-10-CM | POA: Insufficient documentation

## 2016-10-04 DIAGNOSIS — R110 Nausea: Secondary | ICD-10-CM | POA: Diagnosis not present

## 2016-10-04 DIAGNOSIS — F17210 Nicotine dependence, cigarettes, uncomplicated: Secondary | ICD-10-CM | POA: Insufficient documentation

## 2016-10-04 DIAGNOSIS — R1032 Left lower quadrant pain: Secondary | ICD-10-CM | POA: Insufficient documentation

## 2016-10-04 LAB — COMPREHENSIVE METABOLIC PANEL
ALBUMIN: 4.5 g/dL (ref 3.5–5.0)
ALT: 19 U/L (ref 14–54)
AST: 21 U/L (ref 15–41)
Alkaline Phosphatase: 38 U/L (ref 38–126)
Anion gap: 8 (ref 5–15)
BUN: 10 mg/dL (ref 6–20)
CO2: 26 mmol/L (ref 22–32)
Calcium: 9 mg/dL (ref 8.9–10.3)
Chloride: 105 mmol/L (ref 101–111)
Creatinine, Ser: 0.58 mg/dL (ref 0.44–1.00)
GFR calc Af Amer: >60 mL/min (ref >60)
GFR calc non Af Amer: >60 mL/min (ref >60)
GLUCOSE: 109 mg/dL — AB (ref 65–99)
POTASSIUM: 3.5 mmol/L (ref 3.5–5.1)
Sodium: 139 mmol/L (ref 135–145)
Total Bilirubin: 0.7 mg/dL (ref 0.3–1.2)
Total Protein: 7.4 g/dL (ref 6.5–8.1)

## 2016-10-04 LAB — PREGNANCY, URINE: Preg Test, Ur: NEGATIVE

## 2016-10-04 LAB — URINALYSIS, ROUTINE W REFLEX MICROSCOPIC
Bilirubin Urine: NEGATIVE
Glucose, UA: NEGATIVE mg/dL
Ketones, ur: NEGATIVE mg/dL
Leukocytes, UA: NEGATIVE
NITRITE: NEGATIVE
PH: 5 (ref 5.0–8.0)
Protein, ur: NEGATIVE mg/dL
RBC / HPF: NONE SEEN RBC/hpf (ref 0–5)
Specific Gravity, Urine: 1.013 (ref 1.005–1.030)

## 2016-10-04 LAB — WET PREP, GENITAL
CLUE CELLS WET PREP: NONE SEEN
Sperm: NONE SEEN
Trich, Wet Prep: NONE SEEN
YEAST WET PREP: NONE SEEN

## 2016-10-04 LAB — CBC
HEMATOCRIT: 33.8 % — AB (ref 36.0–46.0)
Hemoglobin: 11.8 g/dL — ABNORMAL LOW (ref 12.0–15.0)
MCH: 33 pg (ref 26.0–34.0)
MCHC: 34.9 g/dL (ref 30.0–36.0)
MCV: 94.4 fL (ref 78.0–100.0)
Platelets: 170 10*3/uL (ref 150–400)
RBC: 3.58 MIL/uL — ABNORMAL LOW (ref 3.87–5.11)
RDW: 12.2 % (ref 11.5–15.5)
WBC: 9.7 10*3/uL (ref 4.0–10.5)

## 2016-10-04 LAB — LIPASE, BLOOD: Lipase: 33 U/L (ref 11–51)

## 2016-10-04 MED ORDER — SUCRALFATE 1 GM/10ML PO SUSP: 1.0000 g | Freq: Three times a day (TID) | ORAL | 0 refills | Status: DC

## 2016-10-04 MED ORDER — ONDANSETRON 4 MG PO TBDP: 4.0000 mg | ORAL_TABLET | Freq: Three times a day (TID) | ORAL | 0 refills | Status: DC | PRN

## 2016-10-04 MED ORDER — DICYCLOMINE HCL 20 MG PO TABS: 20.0000 mg | ORAL_TABLET | Freq: Two times a day (BID) | ORAL | 0 refills | Status: DC

## 2016-10-04 NOTE — ED Triage Notes (Signed)
Patient c/o left lower abd pain that started this morning. Patient states nausea, vomited x1. Denies any diarrhea or fevers. Patient also states dysuria.

## 2016-10-04 NOTE — Discharge Instructions (Signed)

## 2016-10-04 NOTE — ED Provider Notes (Signed)
Emergency Department Provider Note   I have reviewed the triage vital signs and the nursing notes.   HISTORY  Chief Complaint Abdominal Pain   HPI Frances Nguyen is a 31 y.o. female with PMH of HTN presents emergency pertinent for evaluation of sudden onset left flank and lower abdominal pain. Patient states the symptoms woke her from sleep early this morning at approximately 3:30 AM. She initially try to go to work but continued having intermittent severe pain so presented to the emergency department. Unknown LMP as she has an IUD in place. No vaginal bleeding or discharge. No fever or chills. Denies any hesitancy or urgency but has had some mild dysuria. Pain radiates slightly to the groin. No modifying factors.    Past Medical History:  Diagnosis Date  . Heart murmur    as an infant  . Hypertension 2011   PIH  . IUD (intrauterine device) in place 08/02/2013  . Seizures (HCC) 2011   during labor- pt was eclamptic    Patient Active Problem List   Diagnosis Date Noted  . Encounter for IUD insertion 09/07/2014  . S/P cesarean section 07/18/2014  . Supervision of normal pregnancy 12/12/2013  . History of cesarean delivery, currently pregnant 12/12/2013  . History of eclampsia 12/12/2013    Past Surgical History:  Procedure Laterality Date  . CESAREAN SECTION    . CESAREAN SECTION N/A 07/18/2014   Procedure: CESAREAN SECTION REPEAT;  Surgeon: Lazaro Arms, MD;  Location: [facility];  Service: Obstetrics;  Laterality: N/A;      Allergies Patient has no known allergies.  Family History  Problem Relation Age of Onset  . COPD Maternal Grandmother   . COPD Mother     Social History Social History  Substance Use Topics  . Smoking status: Former Smoker    Years: 1.00    Types: Cigarettes    Quit date: 01/20/2014  . Smokeless tobacco: Never Used  . Alcohol use Yes     Comment: every once in a while    Review of Systems  Constitutional: No fever/chills Eyes:  No visual changes. ENT: No sore throat. Cardiovascular: Denies chest pain. Respiratory: Denies shortness of breath. Gastrointestinal: Positive left lower abdominal pain.  No nausea, no vomiting.  No diarrhea.  No constipation. Genitourinary: Positive for dysuria. Musculoskeletal: Negative for back pain. Skin: Negative for rash. Neurological: Negative for headaches, focal weakness or numbness.  10-point ROS otherwise negative.  ____________________________________________   PHYSICAL EXAM:  VITAL SIGNS: ED Triage Vitals  Enc Vitals Group     BP 10/04/16 1234 119/72     Pulse Rate 10/04/16 1234 69     Resp 10/04/16 1234 18     Temp 10/04/16 1234 98.4 F (36.9 C)     Temp Source 10/04/16 1234 Oral     SpO2 10/04/16 1234 100 %     Weight 10/04/16 1235 108 lb (49 kg)     Height 10/04/16 1235  (1.575 m)     Pain Score 10/04/16 1235 8   Constitutional: Alert and oriented. Well appearing and in no acute distress. Eyes: Conjunctivae are normal. Head: Atraumatic. Nose: No congestion/rhinnorhea. Mouth/Throat: Mucous membranes are moist.  Oropharynx non-erythematous. Neck: No stridor. Cardiovascular: Normal rate, regular rhythm. Good peripheral circulation. Grossly normal heart sounds.   Respiratory: Normal respiratory effort.  No retractions. Lungs CTAB. Gastrointestinal: Soft with no significant LLQ tenderness to palpation. No distention. Pelvic exam with moderate discharge. No CMT or adnexal tenderness or fullness.  Musculoskeletal: No lower extremity tenderness nor edema. No gross deformities of extremities. Neurologic:  Normal speech and language. No gross focal neurologic deficits are appreciated.  Skin:  Skin is warm, dry and intact. No rash noted.  ____________________________________________   LABS (all labs ordered are listed, but only abnormal results are displayed)  Labs Reviewed  WET PREP, GENITAL - Abnormal; Notable for the following:       Result Value    WBC, Wet Prep HPF POC MODERATE (*)    All other components within normal limits  COMPREHENSIVE METABOLIC PANEL - Abnormal; Notable for the following:    Glucose, Bld 109 (*)    All other components within normal limits  CBC - Abnormal; Notable for the following:    RBC 3.58 (*)    Hemoglobin 11.8 (*)    HCT 33.8 (*)    All other components within normal limits  URINALYSIS, ROUTINE W REFLEX MICROSCOPIC - Abnormal; Notable for the following:    Hgb urine dipstick SMALL (*)    Bacteria, UA RARE (*)    Squamous Epithelial / LPF 0-5 (*)    All other components within normal limits  LIPASE, BLOOD  PREGNANCY, URINE  GC/CHLAMYDIA PROBE AMP ([place]) NOT AT [facility]   ____________________________________________  RADIOLOGY  Ct Renal Stone Study  Result Date: 10/04/2016 CLINICAL DATA:  Lower abdominal pain. EXAM: CT ABDOMEN AND PELVIS WITHOUT CONTRAST TECHNIQUE: Multidetector CT imaging of the abdomen and pelvis was performed following the standard protocol without IV contrast. COMPARISON:  None. FINDINGS: Lower chest: No acute abnormality. Hepatobiliary: No focal liver abnormality is seen. No gallstones, gallbladder wall thickening, or biliary dilatation. Pancreas: Unremarkable. No pancreatic ductal dilatation or surrounding inflammatory changes. Spleen: Normal in size without focal abnormality. Adrenals/Urinary Tract: The adrenal glands are normal. The kidneys are unremarkable. Urinary bladder is unremarkable. Stomach/Bowel: Stomach is within normal limits. Appendix not confidently identified separate from the right lower quadrant bowel loops. Moderate stool burden noted within the colon. No evidence of bowel wall thickening, distention, or inflammatory changes. Vascular/Lymphatic: No significant vascular findings are present. No enlarged abdominal or pelvic lymph nodes. Reproductive: Uterus and bilateral adnexa are unremarkable. IUD is identified within the uterus. Other: Trace amount of free fluid  noted within the dependent portion of the pelvis. Musculoskeletal: No acute or significant osseous findings. IMPRESSION: 1. No acute findings identified within the abdomen or pelvis 2. Nonvisualization of the appendix 3. Trace free fluid noted within the pelvis Electronically Signed   By: Signa Kell M.D.   On: 10/04/2016 15:42    ____________________________________________   PROCEDURES  Procedure(s) performed:   Procedures  None ____________________________________________   INITIAL IMPRESSION / ASSESSMENT AND PLAN / ED COURSE  Pertinent labs & imaging results that were available during my care of the patient were reviewed by me and considered in my medical decision making (see chart for details).  Patient resents to the emergency department for evaluation of left lower abdominal and flank pain that started this morning. She had one episode of nausea. Unknown last menstrual period that she has an IUD. Mild dysuria by history but no infection on UA. Small amount of Hb. Considering kidney stone but will reassess during pelvic exam. Lower suspicion for ovarian torsion in this case.   Wet prep unremarkable. Doubt torsion, TOA, or other ovarian pathology after exam and history review. With small Hb in the urine and sharp, intermittent pain plan for CT renal protocol to evaluate for stone.   CT renal negative.  Patient having additional pain on re-evaluation but now in the epigastric region. No lower abdominal tenderness. Plan for symptom mgmt and PCP follow up.   At this time, I do not feel there is any life-threatening condition present. I have reviewed and discussed all results (EKG, imaging, lab, urine as appropriate), exam findings with patient. I have reviewed nursing notes and appropriate previous records.  I feel the patient is safe to be discharged home without further emergent workup. Discussed usual and customary return precautions. Patient and family (if present) verbalize  understanding and are comfortable with this plan.  Patient will follow-up with their primary care provider. If they do not have a primary care provider, information for follow-up has been provided to them. All questions have been answered.  ____________________________________________  FINAL CLINICAL IMPRESSION(S) / ED DIAGNOSES  Final diagnoses:  Left lower quadrant pain  Nausea     MEDICATIONS GIVEN DURING THIS VISIT:  Medications - No data to display   NEW OUTPATIENT MEDICATIONS STARTED DURING THIS VISIT:  Discharge Medication List as of 10/04/2016  3:51 PM    START taking these medications   Details  dicyclomine (BENTYL) 20 MG tablet Take 1 tablet (20 mg total) by mouth 2 (two) times daily., Starting Sat 10/04/2016, Print    ondansetron (ZOFRAN ODT) 4 MG disintegrating tablet Take 1 tablet (4 mg total) by mouth every 8 (eight) hours as needed for nausea or vomiting., Starting Sat 10/04/2016, Print    sucralfate (CARAFATE) 1 GM/10ML suspension Take 10 mLs (1 g total) by mouth 4 (four) times daily -  with meals and at bedtime., Starting Sat 10/04/2016, Print        Note:  This document was prepared using Dragon voice recognition software and may include unintentional dictation errors.  Alona Bene, MD Emergency Medicine   Ludwig Tugwell, Arlyss Repress, MD 10/04/16 [phone]

## 2016-10-06 LAB — GC/CHLAMYDIA PROBE AMP (~~LOC~~) NOT AT ARMC
CHLAMYDIA, DNA PROBE: NEGATIVE
NEISSERIA GONORRHEA: NEGATIVE

## 2017-04-04 ENCOUNTER — Emergency Department (HOSPITAL_COMMUNITY)
Admission: EM | Admit: 2017-04-04 | Discharge: 2017-04-04 | Disposition: A | Discharge status: Home / Self Care | Payer: Medicaid Other | Attending: Emergency Medicine | Admitting: Emergency Medicine

## 2017-04-04 ENCOUNTER — Other Ambulatory Visit: Payer: Self-pay

## 2017-04-04 ENCOUNTER — Encounter (HOSPITAL_COMMUNITY): Payer: Self-pay | Admitting: Emergency Medicine

## 2017-04-04 DIAGNOSIS — Z79899 Other long term (current) drug therapy: Secondary | ICD-10-CM | POA: Insufficient documentation

## 2017-04-04 DIAGNOSIS — Z87891 Personal history of nicotine dependence: Secondary | ICD-10-CM | POA: Insufficient documentation

## 2017-04-04 DIAGNOSIS — J069 Acute upper respiratory infection, unspecified: Secondary | ICD-10-CM | POA: Diagnosis not present

## 2017-04-04 DIAGNOSIS — R05 Cough: Secondary | ICD-10-CM | POA: Diagnosis present

## 2017-04-04 MED ORDER — BENZONATATE 200 MG PO CAPS: 200.0000 mg | ORAL_CAPSULE | Freq: Three times a day (TID) | ORAL | 0 refills | Status: DC

## 2017-04-04 MED ORDER — PSEUDOEPHEDRINE HCL 60 MG PO TABS: 60.0000 mg | ORAL_TABLET | Freq: Four times a day (QID) | ORAL | 0 refills | Status: DC | PRN

## 2017-04-04 NOTE — ED Provider Notes (Signed)
[facility]GENCY DEPARTMENT Provider Note   CSN: [id number] Arrival date & time: 04/04/17  1200     History   Chief Complaint Chief Complaint  Patient presents with  . Cough    HPI Frances MoreHailey E Nguyen is a 32 y.o. female.  HPI   Frances Nguyen is a 32 y.o. female who presents to the Emergency Department complaining of intermittent cough nasal congestion, sinus pressure and chills.  Symptoms have been present for 1 day.  She states that a coworker was recently diagnosed with the flu she was concerned that she may have it as well.  No known fever.  She states that she is having difficulty breathing through her nose due to congestion.  She has not taken any over-the-counter medications prior to arrival.  She denies chest pain, shortness of breath, wheezing, neck pain or stiffness, and headache.  Past Medical History:  Diagnosis Date  . Heart murmur    as an infant  . Hypertension 2011   PIH  . IUD (intrauterine device) in place 08/02/2013  . Seizures (HCC) 2011   during labor- pt was eclamptic    Patient Active Problem List   Diagnosis Date Noted  . Encounter for IUD insertion 09/07/2014  . S/P cesarean section 07/18/2014  . Supervision of normal pregnancy 12/12/2013  . History of cesarean delivery, currently pregnant 12/12/2013  . History of eclampsia 12/12/2013    Past Surgical History:  Procedure Laterality Date  . CESAREAN SECTION    . CESAREAN SECTION N/A 07/18/2014   Procedure: CESAREAN SECTION REPEAT;  Surgeon: Lazaro ArmsLuther H Eure, MD;  Location: [facility];  Service: Obstetrics;  Laterality: N/A;    OB History    Gravida Para Term Preterm AB Living   2 2 2     2    SAB TAB Ectopic Multiple Live Births         0 2       Home Medications    Prior to Admission medications   Medication Sig Start Date End Date Taking? Authorizing Provider  dicyclomine (BENTYL) 20 MG tablet Take 1 tablet (20 mg total) by mouth 2 (two) times daily. 10/04/16   Long, Arlyss RepressJoshua G, MD    ondansetron (ZOFRAN ODT) 4 MG disintegrating tablet Take 1 tablet (4 mg total) by mouth every 8 (eight) hours as needed for nausea or vomiting. 10/04/16   Long, Arlyss RepressJoshua G, MD  sucralfate (CARAFATE) 1 GM/10ML suspension Take 10 mLs (1 g total) by mouth 4 (four) times daily -  with meals and at bedtime. 10/04/16   Long, Arlyss RepressJoshua G, MD    Family History Family History  Problem Relation Age of Onset  . COPD Maternal Grandmother   . COPD Mother     Social History Social History   Tobacco Use  . Smoking status: Former Smoker    Years: 1.00    Types: Cigarettes    Last attempt to quit: 01/20/2014    Years since quitting: 3.2  . Smokeless tobacco: Never Used  Substance Use Topics  . Alcohol use: Yes    Comment: every once in a while  . Drug use: No     Allergies   Patient has no known allergies.   Review of Systems Review of Systems  Constitutional: Positive for chills. Negative for activity change, appetite change and fever.  HENT: Positive for congestion, rhinorrhea, sinus pain and sore throat. Negative for facial swelling, hearing loss and trouble swallowing.   Eyes: Negative for visual disturbance.  Respiratory: Positive for cough. Negative for shortness of breath, wheezing and stridor.   Gastrointestinal: Negative for nausea and vomiting.  Genitourinary: Negative for dysuria.  Musculoskeletal: Negative for neck pain and neck stiffness.  Skin: Negative for rash.  Neurological: Negative for dizziness, weakness, numbness and headaches.  Hematological: Negative for adenopathy.  Psychiatric/Behavioral: Negative for confusion.  All other systems reviewed and are negative.    Physical Exam Updated Vital Signs BP 107/70 (BP Location: Right Arm)   Pulse 75   Temp 98.7 F (37.1 C) (Oral)   Resp 18   Ht 5\' 1"  (1.549 m)   Wt 49.9 kg (110 lb)   SpO2 97%   BMI 20.78 kg/m   Physical Exam  Constitutional: She is oriented to person, place, and time. She appears well-developed  and well-nourished. No distress.  HENT:  Head: Atraumatic.  Right Ear: Tympanic membrane and ear canal normal.  Left Ear: Tympanic membrane and ear canal normal.  Nose: Mucosal edema and rhinorrhea present. Right sinus exhibits maxillary sinus tenderness. Left sinus exhibits maxillary sinus tenderness.  Mouth/Throat: Uvula is midline, oropharynx is clear and moist and mucous membranes are normal. No oropharyngeal exudate, posterior oropharyngeal edema, posterior oropharyngeal erythema or tonsillar abscesses. No tonsillar exudate.  Neck: Normal range of motion. Neck supple.  Cardiovascular: Normal rate and regular rhythm.  Pulmonary/Chest: Effort normal and breath sounds normal. No stridor. No respiratory distress. She has no wheezes.  Musculoskeletal: Normal range of motion.  Lymphadenopathy:    She has no cervical adenopathy.  Neurological: She is alert and oriented to person, place, and time.  Skin: Skin is warm. Capillary refill takes less than 2 seconds.  Psychiatric: She has a normal mood and affect.  Nursing note and vitals reviewed.    ED Treatments / Results  Labs (all labs ordered are listed, but only abnormal results are displayed) Labs Reviewed - No data to display  EKG  EKG Interpretation None       Radiology No results found.  Procedures Procedures (including critical care time)  Medications Ordered in ED Medications - No data to display   Initial Impression / Assessment and Plan / ED Course  I have reviewed the triage vital signs and the nursing notes.  Pertinent labs & imaging results that were available during my care of the patient were reviewed by me and considered in my medical decision making (see chart for details).     Patient well-appearing.  Symptoms likely viral.  Afebrile.  Possible sinusitis.  Patient agrees to treatment plan with Tylenol ibuprofen and fluids.  Prescription written for Sudafed and Tessalon Perles  Final Clinical  Impressions(s) / ED Diagnoses   Final diagnoses:  Upper respiratory tract infection, unspecified type    ED Discharge Orders    None       Pauline Aus, PA-C 04/04/17 1424    Loren Racer, MD 04/06/17 [phone]

## 2017-04-04 NOTE — Discharge Instructions (Signed)
Alternate Tylenol and ibuprofen every 4 and 6 hours if needed for fever.  Drink plenty of fluids.  Follow-up with your primary doctor or the clinic listed for recheck if needed in 1 week.

## 2017-04-04 NOTE — ED Triage Notes (Signed)
PT c/o nasal congestion, non-productive cough, sore throat x1 day.

## 2017-07-31 ENCOUNTER — Emergency Department (HOSPITAL_COMMUNITY)
Admission: EM | Admit: 2017-07-31 | Discharge: 2017-07-31 | Disposition: A | Discharge status: Home / Self Care | Payer: Medicaid Other | Attending: Emergency Medicine | Admitting: Emergency Medicine

## 2017-07-31 ENCOUNTER — Encounter (HOSPITAL_COMMUNITY): Payer: Self-pay

## 2017-07-31 ENCOUNTER — Other Ambulatory Visit: Payer: Self-pay

## 2017-07-31 DIAGNOSIS — R21 Rash and other nonspecific skin eruption: Secondary | ICD-10-CM | POA: Insufficient documentation

## 2017-07-31 DIAGNOSIS — Y939 Activity, unspecified: Secondary | ICD-10-CM | POA: Insufficient documentation

## 2017-07-31 DIAGNOSIS — S30860A Insect bite (nonvenomous) of lower back and pelvis, initial encounter: Secondary | ICD-10-CM

## 2017-07-31 DIAGNOSIS — Y929 Unspecified place or not applicable: Secondary | ICD-10-CM | POA: Insufficient documentation

## 2017-07-31 DIAGNOSIS — Y999 Unspecified external cause status: Secondary | ICD-10-CM | POA: Insufficient documentation

## 2017-07-31 DIAGNOSIS — W57XXXA Bitten or stung by nonvenomous insect and other nonvenomous arthropods, initial encounter: Secondary | ICD-10-CM | POA: Diagnosis not present

## 2017-07-31 DIAGNOSIS — Z79899 Other long term (current) drug therapy: Secondary | ICD-10-CM | POA: Diagnosis not present

## 2017-07-31 DIAGNOSIS — Z87891 Personal history of nicotine dependence: Secondary | ICD-10-CM | POA: Diagnosis not present

## 2017-07-31 DIAGNOSIS — I10 Essential (primary) hypertension: Secondary | ICD-10-CM | POA: Diagnosis not present

## 2017-07-31 DIAGNOSIS — S20461A Insect bite (nonvenomous) of right back wall of thorax, initial encounter: Secondary | ICD-10-CM | POA: Diagnosis not present

## 2017-07-31 LAB — CBC WITH DIFFERENTIAL/PLATELET
Basophils Absolute: 0 10*3/uL (ref 0.0–0.1)
Basophils Relative: 0 %
EOS ABS: 0 10*3/uL (ref 0.0–0.7)
EOS PCT: 1 %
HCT: 37.4 % (ref 36.0–46.0)
HEMOGLOBIN: 12.5 g/dL (ref 12.0–15.0)
Lymphocytes Relative: 32 %
Lymphs Abs: 2 10*3/uL (ref 0.7–4.0)
MCH: 31.6 pg (ref 26.0–34.0)
MCHC: 33.4 g/dL (ref 30.0–36.0)
MCV: 94.7 fL (ref 78.0–100.0)
Monocytes Absolute: 0.3 10*3/uL (ref 0.1–1.0)
Monocytes Relative: 5 %
NEUTROS PCT: 62 %
Neutro Abs: 3.7 10*3/uL (ref 1.7–7.7)
PLATELETS: 187 10*3/uL (ref 150–400)
RBC: 3.95 MIL/uL (ref 3.87–5.11)
RDW: 12.4 % (ref 11.5–15.5)
WBC: 6 10*3/uL (ref 4.0–10.5)

## 2017-07-31 LAB — COMPREHENSIVE METABOLIC PANEL
ALK PHOS: 44 U/L (ref 38–126)
ALT: 16 U/L (ref 0–44)
AST: 18 U/L (ref 15–41)
Albumin: 4 g/dL (ref 3.5–5.0)
Anion gap: 7 (ref 5–15)
BILIRUBIN TOTAL: 1 mg/dL (ref 0.3–1.2)
BUN: 11 mg/dL (ref 6–20)
CALCIUM: 9 mg/dL (ref 8.9–10.3)
CHLORIDE: 105 mmol/L (ref 98–111)
CO2: 25 mmol/L (ref 22–32)
CREATININE: 0.65 mg/dL (ref 0.44–1.00)
Glucose, Bld: 87 mg/dL (ref 70–99)
Potassium: 3.9 mmol/L (ref 3.5–5.1)
Sodium: 137 mmol/L (ref 135–145)
Total Protein: 7.1 g/dL (ref 6.5–8.1)

## 2017-07-31 LAB — HCG, QUANTITATIVE, PREGNANCY

## 2017-07-31 MED ORDER — DIPHENHYDRAMINE HCL 25 MG PO TABS: 25.0000 mg | ORAL_TABLET | Freq: Four times a day (QID) | ORAL | 0 refills | Status: DC | PRN

## 2017-07-31 MED ORDER — FAMOTIDINE 20 MG PO TABS: 20.0000 mg | ORAL_TABLET | Freq: Two times a day (BID) | ORAL | 0 refills | Status: DC

## 2017-07-31 MED ORDER — DOXYCYCLINE HYCLATE 100 MG PO CAPS: 100.0000 mg | ORAL_CAPSULE | Freq: Two times a day (BID) | ORAL | 0 refills | Status: DC

## 2017-07-31 MED ORDER — TRIAMCINOLONE ACETONIDE 0.1 % EX CREA: 1.0000 "application " | TOPICAL_CREAM | Freq: Two times a day (BID) | CUTANEOUS | 0 refills | Status: DC

## 2017-07-31 NOTE — Discharge Instructions (Signed)
As discussed you have been prescribed doxycycline to treat your recent tick bite.  It is unclear if the rash on your arms is related to the tick bite or if you are reacting some exposure at your job.  However, take the entire course of the doxycycline, as discussed protect your skin from sun exposure while on this medication.  Apply the steroid cream prescribed to your forearms twice daily for treatment of this rash.  Take Pepcid twice daily which will help with itching.  You might also add Benadryl when you are at home or do not need to worry about drowsiness.  Get rechecked for any persistent or worsening symptoms.

## 2017-07-31 NOTE — ED Triage Notes (Signed)
Pt comes to ED today with complaints of rash on arms, stomach and legs which started yesterday.

## 2017-08-01 NOTE — ED Provider Notes (Signed)
[facility] Provider Note   CSN: [id number] Arrival date & time: 07/31/17  1122     History   Chief Complaint Chief Complaint  Patient presents with  . Rash    HPI Frances Nguyen is a 32 y.o. female with no significant chronic medical conditions presenting with a rash on her forearms and hands with smaller involvement on her abdomina which started yesterday.  The rash is itchy and non draining.  She denies any known new exposures but has had a similar rash in the past when using a soap at work while cleaning up an ink spill on the floor (has not been exposed to this soap recently). She reports having a tick bite x 2 in the past several weeks, one on her right chest wall with complete resolution, but one on her right upper back which continues to be itchy and irritated. She denies fevers, chills, abdominal pain, headache, neck pain but has had some episodes of mild nausea. She has has had no treatment prior to arrival and has found no alleviators.   The history is provided by the patient.    Past Medical History:  Diagnosis Date  . Heart murmur    as an infant  . Hypertension 2011   PIH  . IUD (intrauterine device) in place 08/02/2013  . Seizures (HCC) 2011   during labor- pt was eclamptic    Patient Active Problem List   Diagnosis Date Noted  . Encounter for IUD insertion 09/07/2014  . S/P cesarean section 07/18/2014  . Supervision of normal pregnancy 12/12/2013  . History of cesarean delivery, currently pregnant 12/12/2013  . History of eclampsia 12/12/2013    Past Surgical History:  Procedure Laterality Date  . CESAREAN SECTION    . CESAREAN SECTION N/A 07/18/2014   Procedure: CESAREAN SECTION REPEAT;  Surgeon: Lazaro ArmsLuther H Eure, MD;  Location: [facility];  Service: Obstetrics;  Laterality: N/A;     OB History    Gravida  2   Para  2   Term  2   Preterm      AB      Living  2     SAB      TAB      Ectopic      Multiple  0   Live  Births  2            Home Medications    Prior to Admission medications   Medication Sig Start Date End Date Taking? Authorizing Provider  benzonatate (TESSALON) 200 MG capsule Take 1 capsule (200 mg total) by mouth every 8 (eight) hours. Swallow whole, do not chew 04/04/17   Triplett, Tammy, PA-C  dicyclomine (BENTYL) 20 MG tablet Take 1 tablet (20 mg total) by mouth 2 (two) times daily. 10/04/16   Long, Arlyss RepressJoshua G, MD  diphenhydrAMINE (BENADRYL) 25 MG tablet Take 1 tablet (25 mg total) by mouth every 6 (six) hours as needed for itching. 07/31/17   Burgess AmorIdol, Mikeila Burgen, PA-C  doxycycline (VIBRAMYCIN) 100 MG capsule Take 1 capsule (100 mg total) by mouth 2 (two) times daily. 07/31/17   Burgess AmorIdol, Daneisha Surges, PA-C  famotidine (PEPCID) 20 MG tablet Take 1 tablet (20 mg total) by mouth 2 (two) times daily. 07/31/17   Burgess AmorIdol, Guillermina Shaft, PA-C  ondansetron (ZOFRAN ODT) 4 MG disintegrating tablet Take 1 tablet (4 mg total) by mouth every 8 (eight) hours as needed for nausea or vomiting. 10/04/16   Long, Arlyss RepressJoshua G, MD  pseudoephedrine (SUDAFED) 60  MG tablet Take 1 tablet (60 mg total) by mouth every 6 (six) hours as needed for congestion. 04/04/17   Triplett, Tammy, PA-C  sucralfate (CARAFATE) 1 GM/10ML suspension Take 10 mLs (1 g total) by mouth 4 (four) times daily -  with meals and at bedtime. 10/04/16   Long, Arlyss Repress, MD  triamcinolone cream (KENALOG) 0.1 % Apply 1 application topically 2 (two) times daily. Apply to rash on forearms twice daily for up to 14 days. 07/31/17   Burgess Amor, PA-C    Family History Family History  Problem Relation Age of Onset  . COPD Maternal Grandmother   . COPD Mother     Social History Social History   Tobacco Use  . Smoking status: Former Smoker    Years: 1.00    Types: Cigarettes    Last attempt to quit: 01/20/2014    Years since quitting: 3.5  . Smokeless tobacco: Never Used  Substance Use Topics  . Alcohol use: Not Currently    Frequency: Never    Comment: every once in a while    . Drug use: No     Allergies   Patient has no known allergies.   Review of Systems Review of Systems  Constitutional: Negative for chills and fever.  Respiratory: Negative for shortness of breath and wheezing.   Skin: Positive for rash and wound.  Neurological: Negative for numbness.     Physical Exam Updated Vital Signs BP (!) 103/59 (BP Location: Right Arm)   Pulse 62   Temp 98.5 F (36.9 C) (Oral)   Resp 17   Ht 5\' 2"  (1.575 m)   Wt 50.3 kg (111 lb)   SpO2 97%   BMI 20.30 kg/m   Physical Exam  Constitutional: She appears well-developed and well-nourished. No distress.  HENT:  Head: Normocephalic.  Neck: Neck supple.  Cardiovascular: Normal rate.  Pulmonary/Chest: Effort normal. She has no wheezes.  Musculoskeletal: Normal range of motion. She exhibits no edema.  Skin: Rash noted.  Raised papular, flesh colored rash bilateral dorsal hands and forearms.  Dry, no vesicles, no erythema.  Raised papule right upper back at site of tick bite. No surrounding erythema or bullseye lesion.  No retained foreign body appreciated. No rash noted on trunk. There is copious scattered non blanching macules on upper and lower anterior legs, feet and toes. Palm and sole sparing.      ED Treatments / Results  Labs (all labs ordered are listed, but only abnormal results are displayed) Labs Reviewed  COMPREHENSIVE METABOLIC PANEL  CBC WITH DIFFERENTIAL/PLATELET  HCG, QUANTITATIVE, PREGNANCY    EKG None  Radiology No results found.  Procedures Procedures (including critical care time)  Medications Ordered in ED Medications - No data to display   Initial Impression / Assessment and Plan / ED Course  I have reviewed the triage vital signs and the nursing notes.  Pertinent labs & imaging results that were available during my care of the patient were reviewed by me and considered in my medical decision making (see chart for details).     Pt with rash and petechiae,  vague nausea.  Rash on her arms appear most consistent with a contact dermatitis, slightly raised and itchy - she was placed on topical steroid cream for this. Also advised benadryl which pt has, pepcid added for additional anti histamine tx.  She has petechiae limited to her anterior legs and feet, normal labs , platelets normal range. No constitutional sx except vague nausea. She  is not pregnant.  VSS , labs normal.  She was covered for possible tick borne infection given significant exposure with petechiae and nausea but no other obvious possible source. Started on doxycycline here.  Pt discussed with Dr Hyacinth Meeker prior to dc home.     Final Clinical Impressions(s) / ED Diagnoses   Final diagnoses:  Rash  Tick bite of back, initial encounter    ED Discharge Orders        Ordered    doxycycline (VIBRAMYCIN) 100 MG capsule  2 times daily     07/31/17 1335    triamcinolone cream (KENALOG) 0.1 %  2 times daily     07/31/17 1335    famotidine (PEPCID) 20 MG tablet  2 times daily     07/31/17 1342    diphenhydrAMINE (BENADRYL) 25 MG tablet  Every 6 hours PRN     07/31/17 1342       Burgess Amor, PA-C 08/01/17 1728    Eber Hong, MD 08/04/17 1000

## 2019-03-10 ENCOUNTER — Other Ambulatory Visit: Payer: Medicaid Other | Admitting: Advanced Practice Midwife

## 2019-03-16 ENCOUNTER — Ambulatory Visit: Payer: Medicaid Other | Admitting: Advanced Practice Midwife

## 2019-03-16 ENCOUNTER — Other Ambulatory Visit: Payer: Self-pay

## 2019-03-16 ENCOUNTER — Other Ambulatory Visit (HOSPITAL_COMMUNITY)
Admission: RE | Admit: 2019-03-16 | Discharge: 2019-03-16 | Disposition: A | Discharge status: Home / Self Care | Payer: Medicaid Other | Source: Ambulatory Visit | Attending: Advanced Practice Midwife | Admitting: Advanced Practice Midwife

## 2019-03-16 ENCOUNTER — Encounter: Payer: Self-pay | Admitting: Advanced Practice Midwife

## 2019-03-16 VITALS — BP 123/83 | HR 95 | Ht 62.0 in | Wt 114.0 lb

## 2019-03-16 DIAGNOSIS — Z01419 Encounter for gynecological examination (general) (routine) without abnormal findings: Secondary | ICD-10-CM | POA: Diagnosis not present

## 2019-03-16 DIAGNOSIS — Z0000 Encounter for general adult medical examination without abnormal findings: Secondary | ICD-10-CM

## 2019-03-16 NOTE — Progress Notes (Signed)
   WELL-WOMAN EXAMINATION Patient name: Frances Nguyen MRN [id number]  Date of birth: 04-Nov-1985 Chief Complaint:   Gynecologic Exam  History of Present Illness:   Frances Nguyen is a 34 y.o. G45P2002 Caucasian female being seen today for a routine well-woman exam.  Current complaints: doing well; no concerns; had IUD placement Aug 2016; requests cultures on Pap  PCP: none- recommended [facility]       No LMP recorded. (Menstrual status: IUD). The current method of family planning is IUD.  Last pap Sept 2016. Results were: normal. H/O abnormal pap: No Last mammogram: never. Family h/o breast cancer: No Last colonoscopy: never. Family h/o colorectal cancer: No Review of Systems:   Pertinent items are noted in HPI Denies any headaches, blurred vision, fatigue, shortness of breath, chest pain, abdominal pain, abnormal vaginal discharge/itching/odor/irritation, problems with periods, bowel movements, urination, or intercourse unless otherwise stated above. Pertinent History Reviewed:  Reviewed past medical,surgical, social and family history.  Reviewed problem list, medications and allergies. Physical Assessment:   Vitals:   03/16/19 1501  BP: 123/83  Pulse: 95  Weight: 114 lb (51.7 kg)  Height: 5\' 2"  (1.575 m)  Body mass index is 20.85 kg/m.        Physical Examination:   General appearance - well appearing, and in no distress  Mental status - alert, oriented to person, place, and time  Psych:  She has a normal mood and affect  Skin - warm and dry, normal color, no suspicious lesions noted  Chest - effort normal, all lung fields clear to auscultation bilaterally  Heart - normal rate and regular rhythm  Neck:  midline trachea, no thyromegaly or nodules  Breasts - breasts appear normal, no suspicious masses, no skin or nipple changes or  axillary nodes  Abdomen - soft, nontender, nondistended, no masses or organomegaly  Pelvic - VULVA: normal appearing vulva with no  masses, tenderness or lesions  VAGINA: normal appearing vagina with normal color and discharge, no lesions  CERVIX: normal appearing cervix without discharge or lesions, no CMT  Thin prep pap is done with HR HPV cotesting  UTERUS: uterus is felt to be normal size, shape, consistency and nontender   ADNEXA: No adnexal masses or tenderness noted.  Extremities:  No swelling or varicosities noted  Chaperone: Amanda Rash    No results found for this or any previous visit (from the past 24 hour(s)).  Assessment & Plan:  1) Well-Woman Exam  2) IUD in place> due for removal/replacement Aug 2022  Labs/procedures today: Pap/GC/chlam  Mammogram age 55 or sooner if problems Colonoscopy age 73 or sooner if problems  No orders of the defined types were placed in this encounter.   Meds: No orders of the defined types were placed in this encounter.   Follow-up: Return for prn.  44 CNM 03/16/2019 3:22 PM

## 2019-03-22 LAB — CYTOLOGY - PAP
Chlamydia: NEGATIVE
Comment: NEGATIVE
Comment: NEGATIVE
Comment: NORMAL
Diagnosis: NEGATIVE
High risk HPV: NEGATIVE
Neisseria Gonorrhea: NEGATIVE

## 2019-04-11 IMAGING — CT CT RENAL STONE PROTOCOL
2 of 4 series · 17 of 46 positions shown, 19 images · non-contrast
Comparison: None.

CLINICAL DATA: Lower abdominal pain.

EXAM:
CT ABDOMEN AND PELVIS WITHOUT CONTRAST
TECHNIQUE: Multidetector CT imaging of the abdomen and pelvis was performed
following the standard protocol without IV contrast.

[Series 2: axial st · axial · 0.68mm/px · z∈[+951,+1326]mm · 14 of 81 slices shown, 16 images]
[im 3/81  soft-tissue]
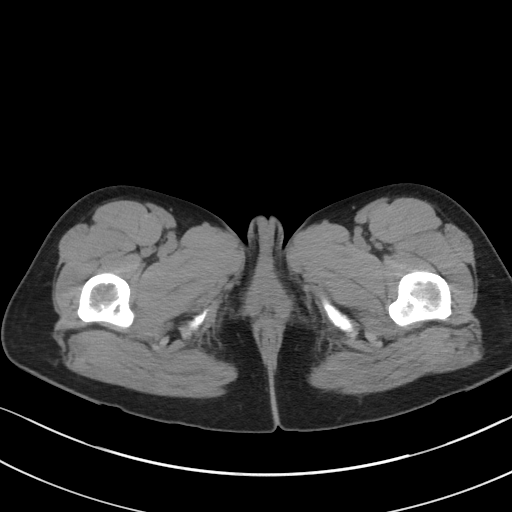
[im 3/81  bone]
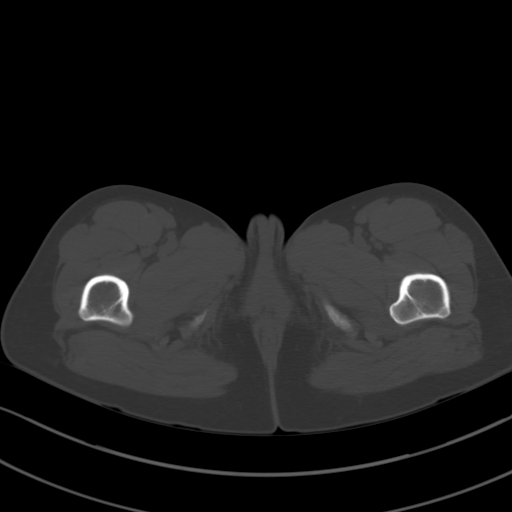
[im 9/81  soft-tissue]
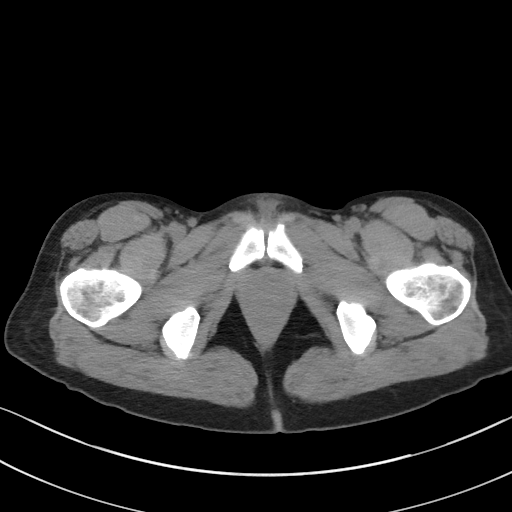
[im 15/81  soft-tissue]
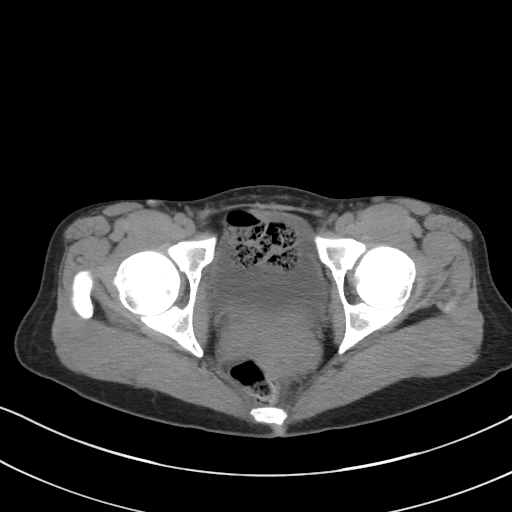
[im 21/81  soft-tissue]
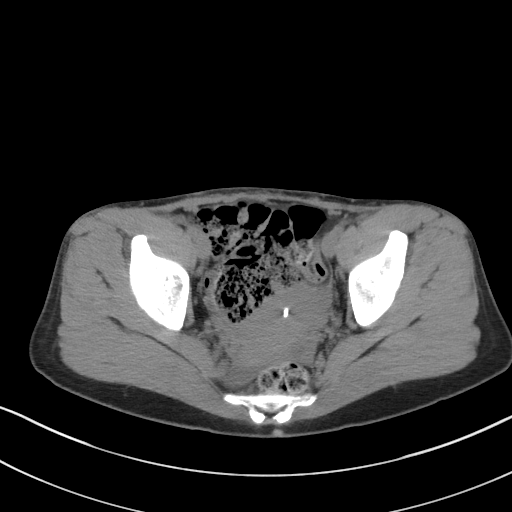
[im 27/81  soft-tissue]
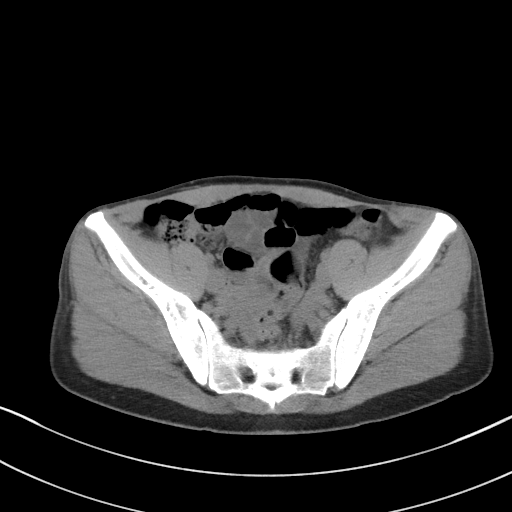
[im 33/81  soft-tissue]
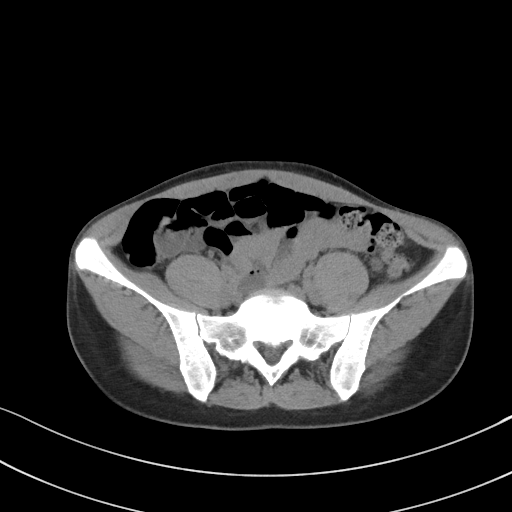
[im 39/81  soft-tissue]
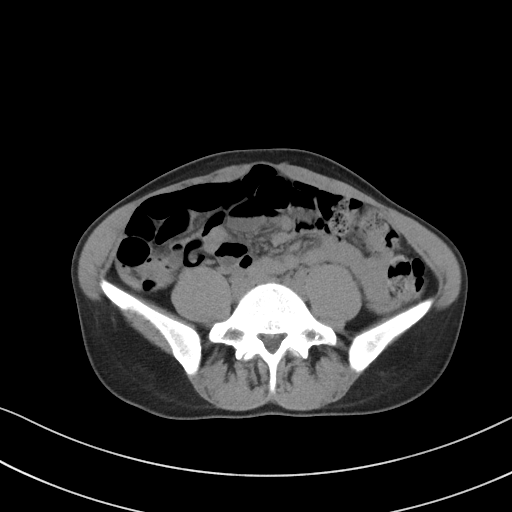
[im 42/81  soft-tissue]
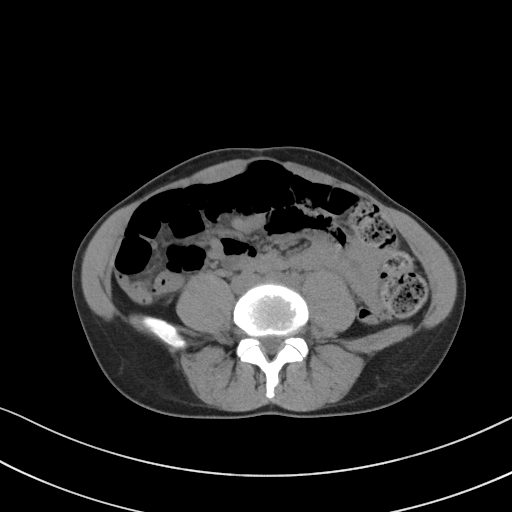
[im 48/81  soft-tissue]
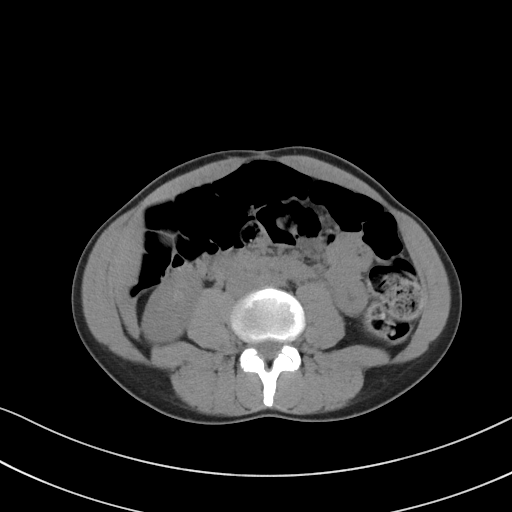
[im 48/81  bone]
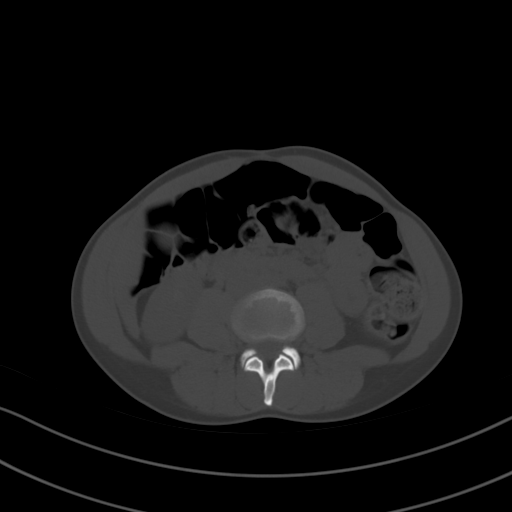
[im 54/81  soft-tissue]
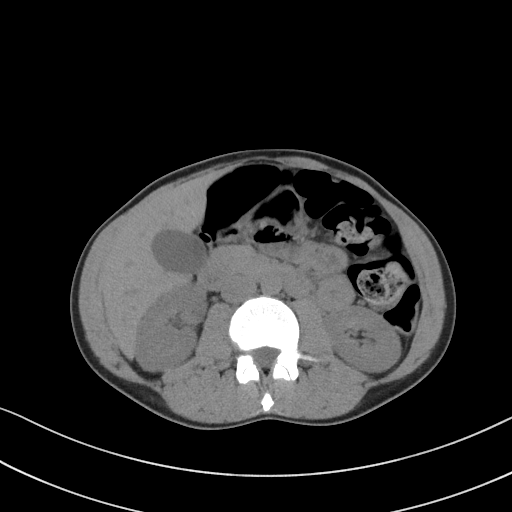
[im 60/81  soft-tissue]
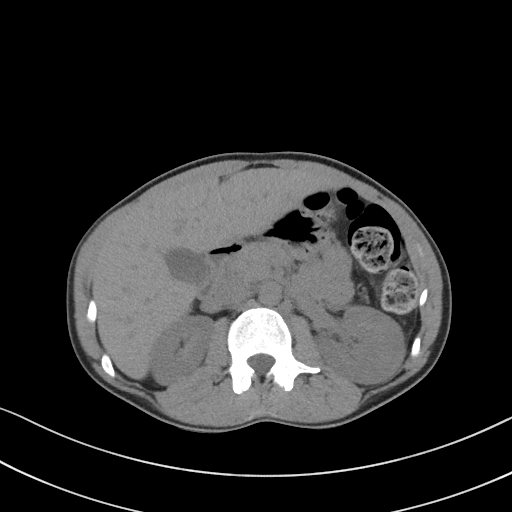
[im 66/81  soft-tissue]
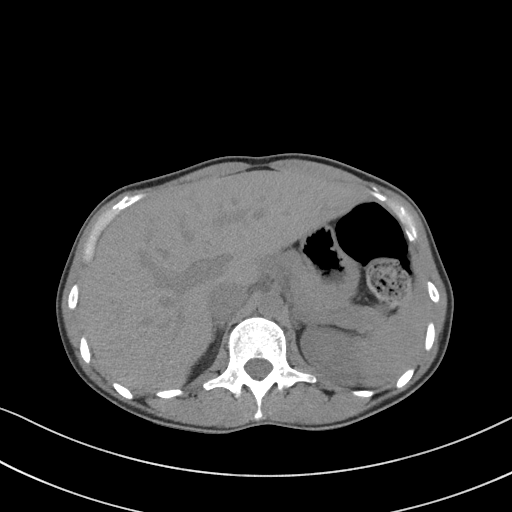
[im 72/81  soft-tissue]
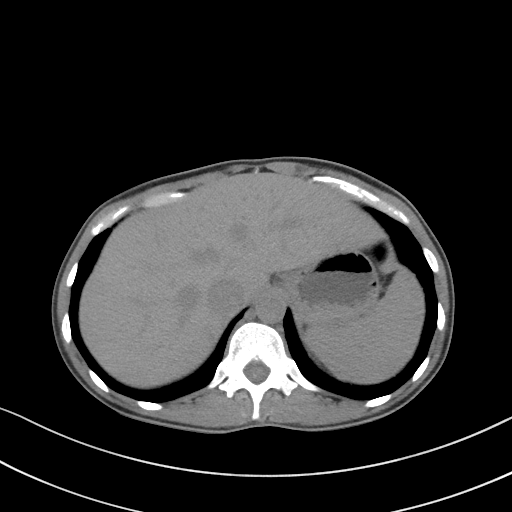
[im 78/81  soft-tissue]
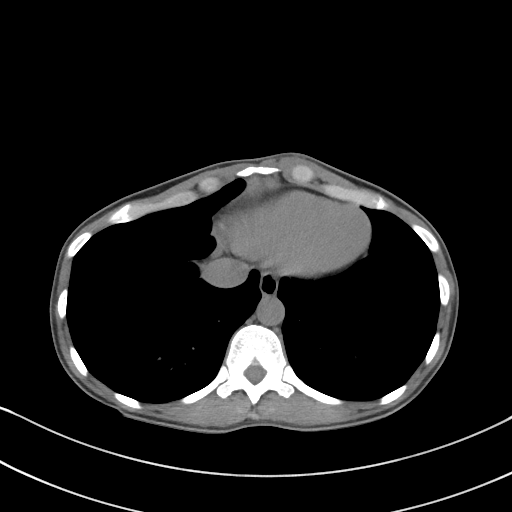

[Series 5: coronal st · coronal · 0.79mm/px · 3 of 68 slices shown]
[im 23/68  soft-tissue]
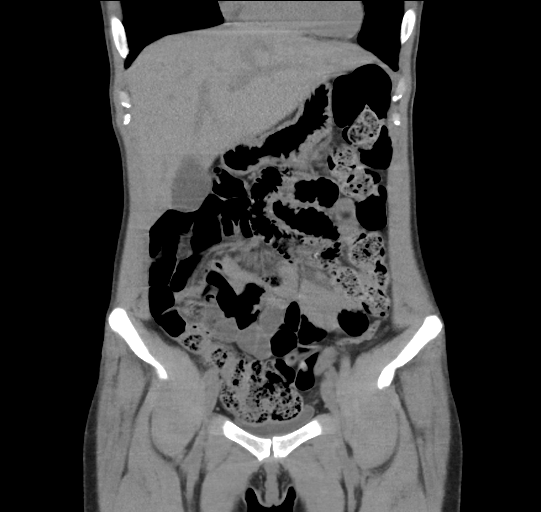
[im 30/68  soft-tissue]
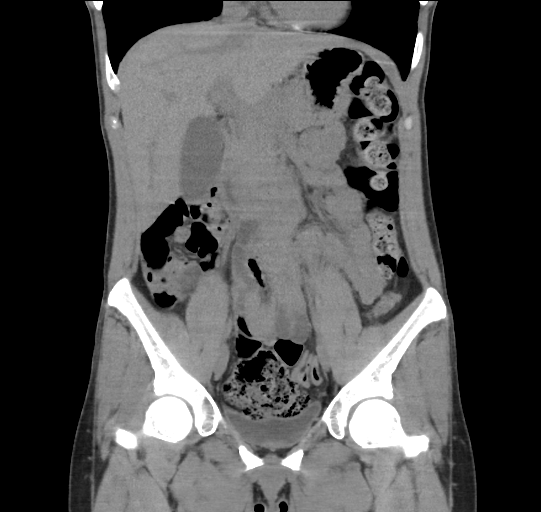
[im 38/68  soft-tissue]
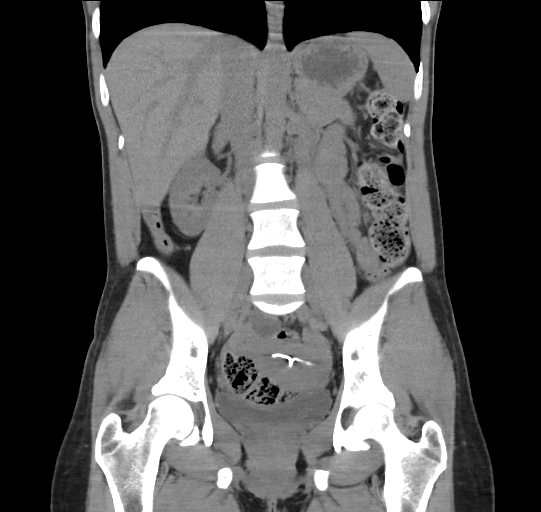

[17 of 46 positions shown; findings below may reference images not displayed]

FINDINGS: Lower chest: No acute abnormality.

Hepatobiliary: No focal liver abnormality is seen. No gallstones,
gallbladder wall thickening, or biliary dilatation.

Pancreas: Unremarkable. No pancreatic ductal dilatation or
surrounding inflammatory changes.

Spleen: Normal in size without focal abnormality.

Adrenals/Urinary Tract: The adrenal glands are normal. The kidneys
are unremarkable. Urinary bladder is unremarkable.

Stomach/Bowel: Stomach is within normal limits. Appendix not
confidently identified separate from the right lower quadrant bowel
loops. Moderate stool burden noted within the colon. No evidence of
bowel wall thickening, distention, or inflammatory changes.

Vascular/Lymphatic: No significant vascular findings are present. No
enlarged abdominal or pelvic lymph nodes.

Reproductive: Uterus and bilateral adnexa are unremarkable. IUD is
identified within the uterus.

Other: Trace amount of free fluid noted within the dependent portion
of the pelvis.

Musculoskeletal: No acute or significant osseous findings.
IMPRESSION: 1. No acute findings identified within the abdomen or pelvis
2. Nonvisualization of the appendix
3. Trace free fluid noted within the pelvis

## 2020-07-09 ENCOUNTER — Other Ambulatory Visit (HOSPITAL_COMMUNITY)
Admission: RE | Admit: 2020-07-09 | Discharge: 2020-07-09 | Disposition: A | Discharge status: Home / Self Care | Payer: Medicaid Other | Source: Ambulatory Visit | Attending: Obstetrics & Gynecology | Admitting: Obstetrics & Gynecology

## 2020-07-09 ENCOUNTER — Other Ambulatory Visit (INDEPENDENT_AMBULATORY_CARE_PROVIDER_SITE_OTHER): Payer: Medicaid Other

## 2020-07-09 ENCOUNTER — Other Ambulatory Visit: Payer: Self-pay

## 2020-07-09 DIAGNOSIS — R399 Unspecified symptoms and signs involving the genitourinary system: Secondary | ICD-10-CM | POA: Diagnosis not present

## 2020-07-09 DIAGNOSIS — N898 Other specified noninflammatory disorders of vagina: Secondary | ICD-10-CM | POA: Diagnosis not present

## 2020-07-09 LAB — POCT URINALYSIS DIPSTICK OB
Blood, UA: NEGATIVE
Glucose, UA: NEGATIVE
Ketones, UA: NEGATIVE
Leukocytes, UA: NEGATIVE
Nitrite, UA: NEGATIVE
POC,PROTEIN,UA: NEGATIVE

## 2020-07-09 NOTE — Progress Notes (Signed)
   NURSE VISIT- VAGINITIS/STD/POC  SUBJECTIVE:  Frances Nguyen is a 35 y.o. [id number] GYN patientfemale here for a vaginal swab for vaginitis screening, STD screen.  She reports the following symptoms: burning, urinary symptoms of flank pain on the right, lower abdominal pain, urinary frequency, urinary urgency, and burning, and vulvar itching for 5 days. Denies abnormal vaginal bleeding, significant pelvic pain, fever, or UTI symptoms.  OBJECTIVE:  There were no vitals taken for this visit.  Appears well, in no apparent distress  ASSESSMENT: Vaginal swab for  vaginitis & STD screening  PLAN: Self-collected vaginal probe for Gonorrhea, Chlamydia, Trichomonas, Bacterial Vaginosis, Yeast sent to lab Treatment: to be determined once results are received Follow-up as needed if symptoms persist/worsen, or new symptoms develop    NURSE VISIT- UTI SYMPTOMS   SUBJECTIVE:  Frances Nguyen is a 35 y.o. G32P2002 female here for UTI symptoms. She is a GYN patient. She reports cloudy urine, flank pain on the right, lower abdominal pain, urinary frequency, urinary urgency, and burning .  OBJECTIVE:  There were no vitals taken for this visit.  Appears well, in no apparent distress  Results for orders placed or performed in visit on 07/09/20 (from the past 24 hour(s))  POC Urinalysis Dipstick OB   Collection Time: 07/09/20 10:55 AM  Result Value Ref Range   Color, UA     Clarity, UA     Glucose, UA Negative Negative   Bilirubin, UA     Ketones, UA neg    Spec Grav, UA     Blood, UA neg    pH, UA     POC,PROTEIN,UA Negative Negative, Trace, Small (1+), Moderate (2+), Large (3+), 4+   Urobilinogen, UA     Nitrite, UA neg    Leukocytes, UA Negative Negative   Appearance     Odor      ASSESSMENT: GYN patient with UTI symptoms and negative nitrites  PLAN: Note routed to Cyril Mourning, AGNP   Rx sent by provider today: No Urine culture sent Call or return to clinic prn if  these symptoms worsen or fail to improve as anticipated. Follow-up: as needed   Chidi Shirer A Esli Clements  07/09/2020 10:55 AM

## 2020-07-09 NOTE — Progress Notes (Signed)
Chart reviewed for nurse visit. Agree with plan of care.  Adline Potter, NP 07/09/2020 11:08 AM

## 2020-07-10 LAB — CERVICOVAGINAL ANCILLARY ONLY
Bacterial Vaginitis (gardnerella): NEGATIVE
Candida Glabrata: NEGATIVE
Candida Vaginitis: NEGATIVE
Chlamydia: NEGATIVE
Comment: NEGATIVE
Comment: NEGATIVE
Comment: NEGATIVE
Comment: NEGATIVE
Comment: NEGATIVE
Comment: NORMAL
Neisseria Gonorrhea: NEGATIVE
Trichomonas: NEGATIVE

## 2020-07-11 LAB — URINE CULTURE

## 2023-08-07 ENCOUNTER — Encounter: Payer: Self-pay | Admitting: Advanced Practice Midwife
